# Patient Record
Sex: Female | Born: 1975
Health system: Southern US, Community
[De-identification: ages and names within clinical notes are randomized; demographics above are authoritative.]

## PROBLEM LIST (undated history)

## (undated) DIAGNOSIS — R112 Nausea with vomiting, unspecified: Secondary | ICD-10-CM

## (undated) DIAGNOSIS — N649 Disorder of breast, unspecified: Secondary | ICD-10-CM

## (undated) DIAGNOSIS — I10 Essential (primary) hypertension: Secondary | ICD-10-CM

## (undated) DIAGNOSIS — F988 Other specified behavioral and emotional disorders with onset usually occurring in childhood and adolescence: Secondary | ICD-10-CM

## (undated) DIAGNOSIS — Z9889 Other specified postprocedural states: Secondary | ICD-10-CM

## (undated) HISTORY — PX: VAGINAL HYSTERECTOMY: SUR661

## (undated) HISTORY — PX: WRIST GANGLION EXCISION: SUR520

## (undated) HISTORY — PX: CERVICAL DISC SURGERY: SHX588

## (undated) HISTORY — DX: Disorder of breast, unspecified: N64.9

## (undated) HISTORY — DX: Other specified behavioral and emotional disorders with onset usually occurring in childhood and adolescence: F98.8

## (undated) HISTORY — PX: ABDOMINAL HYSTERECTOMY: SHX81

## (undated) HISTORY — PX: HERNIA REPAIR: SHX51

## (undated) HISTORY — PX: CYSTO: SHX6284

---

## 1999-03-11 ENCOUNTER — Inpatient Hospital Stay (HOSPITAL_COMMUNITY): Admission: AD | Admit: 1999-03-11 | Discharge: 1999-03-13 | Payer: Self-pay | Admitting: Gynecology

## 2001-05-06 ENCOUNTER — Other Ambulatory Visit: Admission: RE | Admit: 2001-05-06 | Discharge: 2001-05-06 | Payer: Self-pay | Admitting: Gynecology

## 2001-05-31 ENCOUNTER — Other Ambulatory Visit: Admission: RE | Admit: 2001-05-31 | Discharge: 2001-05-31 | Payer: Self-pay | Admitting: *Deleted

## 2001-11-09 ENCOUNTER — Encounter: Payer: Self-pay | Admitting: Emergency Medicine

## 2001-11-09 ENCOUNTER — Emergency Department (HOSPITAL_COMMUNITY): Admission: EM | Admit: 2001-11-09 | Discharge: 2001-11-09 | Payer: Self-pay | Admitting: Emergency Medicine

## 2002-11-26 ENCOUNTER — Encounter: Payer: Self-pay | Admitting: Emergency Medicine

## 2002-11-26 ENCOUNTER — Emergency Department (HOSPITAL_COMMUNITY): Admission: EM | Admit: 2002-11-26 | Discharge: 2002-11-26 | Payer: Self-pay | Admitting: Emergency Medicine

## 2002-11-27 ENCOUNTER — Encounter: Payer: Self-pay | Admitting: Emergency Medicine

## 2002-11-27 ENCOUNTER — Ambulatory Visit (HOSPITAL_COMMUNITY): Admission: RE | Admit: 2002-11-27 | Discharge: 2002-11-27 | Payer: Self-pay | Admitting: Unknown Physician Specialty

## 2005-06-16 ENCOUNTER — Emergency Department (HOSPITAL_COMMUNITY): Admission: EM | Admit: 2005-06-16 | Discharge: 2005-06-16 | Payer: Self-pay | Admitting: Family Medicine

## 2005-08-30 ENCOUNTER — Emergency Department (HOSPITAL_COMMUNITY): Admission: EM | Admit: 2005-08-30 | Discharge: 2005-08-30 | Payer: Self-pay | Admitting: Emergency Medicine

## 2005-12-21 ENCOUNTER — Inpatient Hospital Stay (HOSPITAL_COMMUNITY): Admission: RE | Admit: 2005-12-21 | Discharge: 2005-12-22 | Payer: Self-pay | Admitting: Obstetrics and Gynecology

## 2005-12-21 ENCOUNTER — Encounter (INDEPENDENT_AMBULATORY_CARE_PROVIDER_SITE_OTHER): Payer: Self-pay | Admitting: Specialist

## 2005-12-22 ENCOUNTER — Emergency Department (HOSPITAL_COMMUNITY): Admission: EM | Admit: 2005-12-22 | Discharge: 2005-12-22 | Payer: Self-pay | Admitting: Emergency Medicine

## 2006-02-10 ENCOUNTER — Inpatient Hospital Stay (HOSPITAL_COMMUNITY): Admission: AD | Admit: 2006-02-10 | Discharge: 2006-02-14 | Payer: Self-pay | Admitting: Obstetrics and Gynecology

## 2006-02-10 ENCOUNTER — Emergency Department (HOSPITAL_COMMUNITY): Admission: EM | Admit: 2006-02-10 | Discharge: 2006-02-10 | Payer: Self-pay | Admitting: Emergency Medicine

## 2006-02-12 ENCOUNTER — Encounter (INDEPENDENT_AMBULATORY_CARE_PROVIDER_SITE_OTHER): Payer: Self-pay | Admitting: *Deleted

## 2006-06-19 ENCOUNTER — Emergency Department (HOSPITAL_COMMUNITY): Admission: EM | Admit: 2006-06-19 | Discharge: 2006-06-19 | Payer: Self-pay | Admitting: Family Medicine

## 2006-06-19 ENCOUNTER — Ambulatory Visit (HOSPITAL_COMMUNITY): Admission: RE | Admit: 2006-06-19 | Discharge: 2006-06-19 | Payer: Self-pay | Admitting: Family Medicine

## 2006-07-16 ENCOUNTER — Inpatient Hospital Stay (HOSPITAL_COMMUNITY): Admission: RE | Admit: 2006-07-16 | Discharge: 2006-07-17 | Payer: Self-pay | Admitting: Surgery

## 2006-08-30 ENCOUNTER — Ambulatory Visit (HOSPITAL_COMMUNITY): Admission: RE | Admit: 2006-08-30 | Discharge: 2006-08-30 | Payer: Self-pay | Admitting: General Surgery

## 2006-11-06 ENCOUNTER — Ambulatory Visit (HOSPITAL_COMMUNITY): Admission: RE | Admit: 2006-11-06 | Discharge: 2006-11-06 | Payer: Self-pay | Admitting: Surgery

## 2007-01-14 ENCOUNTER — Ambulatory Visit (HOSPITAL_COMMUNITY): Admission: RE | Admit: 2007-01-14 | Discharge: 2007-01-14 | Payer: Self-pay | Admitting: Surgery

## 2007-03-06 ENCOUNTER — Ambulatory Visit (HOSPITAL_COMMUNITY): Admission: RE | Admit: 2007-03-06 | Discharge: 2007-03-07 | Payer: Self-pay | Admitting: Surgery

## 2007-04-01 ENCOUNTER — Emergency Department (HOSPITAL_COMMUNITY): Admission: EM | Admit: 2007-04-01 | Discharge: 2007-04-01 | Payer: Self-pay | Admitting: Emergency Medicine

## 2007-07-19 ENCOUNTER — Emergency Department (HOSPITAL_COMMUNITY): Admission: EM | Admit: 2007-07-19 | Discharge: 2007-07-19 | Payer: Self-pay | Admitting: Emergency Medicine

## 2007-07-19 ENCOUNTER — Ambulatory Visit (HOSPITAL_COMMUNITY): Admission: RE | Admit: 2007-07-19 | Discharge: 2007-07-19 | Payer: Self-pay | Admitting: Surgery

## 2007-09-04 ENCOUNTER — Ambulatory Visit (HOSPITAL_COMMUNITY): Admission: RE | Admit: 2007-09-04 | Discharge: 2007-09-04 | Payer: Self-pay | Admitting: Family Medicine

## 2007-10-11 ENCOUNTER — Encounter (HOSPITAL_COMMUNITY): Admission: RE | Admit: 2007-10-11 | Discharge: 2007-11-10 | Payer: Self-pay | Admitting: Family Medicine

## 2008-01-08 ENCOUNTER — Ambulatory Visit (HOSPITAL_COMMUNITY): Admission: RE | Admit: 2008-01-08 | Discharge: 2008-01-09 | Payer: Self-pay | Admitting: Neurosurgery

## 2008-03-18 ENCOUNTER — Emergency Department (HOSPITAL_COMMUNITY): Admission: EM | Admit: 2008-03-18 | Discharge: 2008-03-18 | Payer: Self-pay | Admitting: Family Medicine

## 2008-07-02 ENCOUNTER — Ambulatory Visit (HOSPITAL_COMMUNITY): Admission: RE | Admit: 2008-07-02 | Discharge: 2008-07-02 | Payer: Self-pay | Admitting: General Surgery

## 2008-08-12 ENCOUNTER — Encounter (INDEPENDENT_AMBULATORY_CARE_PROVIDER_SITE_OTHER): Payer: Self-pay | Admitting: General Surgery

## 2008-08-12 ENCOUNTER — Ambulatory Visit (HOSPITAL_COMMUNITY): Admission: RE | Admit: 2008-08-12 | Discharge: 2008-08-13 | Payer: Self-pay | Admitting: General Surgery

## 2008-09-24 ENCOUNTER — Ambulatory Visit (HOSPITAL_COMMUNITY): Admission: RE | Admit: 2008-09-24 | Discharge: 2008-09-24 | Payer: Self-pay | Admitting: Obstetrics and Gynecology

## 2008-09-25 ENCOUNTER — Ambulatory Visit (HOSPITAL_COMMUNITY): Admission: RE | Admit: 2008-09-25 | Discharge: 2008-09-25 | Payer: Self-pay | Admitting: Obstetrics and Gynecology

## 2009-04-02 ENCOUNTER — Emergency Department (HOSPITAL_COMMUNITY): Admission: EM | Admit: 2009-04-02 | Discharge: 2009-04-02 | Payer: Self-pay | Admitting: Emergency Medicine

## 2010-05-08 ENCOUNTER — Encounter: Payer: Self-pay | Admitting: Family Medicine

## 2010-05-08 ENCOUNTER — Encounter: Payer: Self-pay | Admitting: General Surgery

## 2010-05-09 ENCOUNTER — Encounter: Payer: Self-pay | Admitting: Obstetrics and Gynecology

## 2010-07-04 ENCOUNTER — Other Ambulatory Visit: Payer: Self-pay | Admitting: Adult Health

## 2010-07-04 ENCOUNTER — Other Ambulatory Visit (HOSPITAL_COMMUNITY)
Admission: RE | Admit: 2010-07-04 | Discharge: 2010-07-04 | Disposition: A | Discharge status: Home / Self Care | Payer: 59 | Source: Ambulatory Visit | Attending: Obstetrics and Gynecology | Admitting: Obstetrics and Gynecology

## 2010-07-04 DIAGNOSIS — N6459 Other signs and symptoms in breast: Secondary | ICD-10-CM | POA: Insufficient documentation

## 2010-07-06 ENCOUNTER — Ambulatory Visit (HOSPITAL_COMMUNITY)
Admission: RE | Admit: 2010-07-06 | Discharge: 2010-07-06 | Disposition: A | Discharge status: Home / Self Care | Payer: 59 | Source: Ambulatory Visit | Attending: Obstetrics and Gynecology | Admitting: Obstetrics and Gynecology

## 2010-07-06 ENCOUNTER — Other Ambulatory Visit: Payer: Self-pay | Admitting: Obstetrics and Gynecology

## 2010-07-06 ENCOUNTER — Other Ambulatory Visit: Payer: Self-pay | Admitting: Obstetrics & Gynecology

## 2010-07-06 DIAGNOSIS — N63 Unspecified lump in unspecified breast: Secondary | ICD-10-CM

## 2010-07-13 ENCOUNTER — Ambulatory Visit (HOSPITAL_COMMUNITY)
Admission: RE | Admit: 2010-07-13 | Discharge: 2010-07-13 | Disposition: A | Discharge status: Home / Self Care | Payer: 59 | Source: Ambulatory Visit | Attending: Obstetrics and Gynecology | Admitting: Obstetrics and Gynecology

## 2010-07-13 ENCOUNTER — Other Ambulatory Visit: Payer: Self-pay | Admitting: Obstetrics and Gynecology

## 2010-07-13 ENCOUNTER — Other Ambulatory Visit: Payer: Self-pay | Admitting: Radiology

## 2010-07-13 DIAGNOSIS — N63 Unspecified lump in unspecified breast: Secondary | ICD-10-CM

## 2010-07-13 DIAGNOSIS — D249 Benign neoplasm of unspecified breast: Secondary | ICD-10-CM | POA: Insufficient documentation

## 2010-07-25 LAB — CULTURE, ROUTINE-ABSCESS

## 2010-07-27 LAB — DIFFERENTIAL
Eosinophils Absolute: 0.2 10*3/uL (ref 0.0–0.7)
Eosinophils Relative: 2 % (ref 0–5)
Lymphs Abs: 3.9 10*3/uL (ref 0.7–4.0)
Lymphs Abs: 4.7 10*3/uL — ABNORMAL HIGH (ref 0.7–4.0)
Monocytes Absolute: 0.7 10*3/uL (ref 0.1–1.0)
Monocytes Absolute: 1 10*3/uL (ref 0.1–1.0)
Monocytes Relative: 8 % (ref 3–12)
Neutrophils Relative %: 57 % (ref 43–77)

## 2010-07-27 LAB — CBC
HCT: 41.3 % (ref 36.0–46.0)
Hemoglobin: 14.4 g/dL (ref 12.0–15.0)
MCV: 93 fL (ref 78.0–100.0)
MCV: 93 fL (ref 78.0–100.0)
Platelets: 307 10*3/uL (ref 150–400)
Platelets: 344 10*3/uL (ref 150–400)
RDW: 15.2 % (ref 11.5–15.5)
RDW: 15.5 % (ref 11.5–15.5)
WBC: 10.8 10*3/uL — ABNORMAL HIGH (ref 4.0–10.5)
WBC: 13.6 10*3/uL — ABNORMAL HIGH (ref 4.0–10.5)

## 2010-07-27 LAB — BASIC METABOLIC PANEL
BUN: 10 mg/dL (ref 6–23)
Chloride: 99 mEq/L (ref 96–112)
Glucose, Bld: 91 mg/dL (ref 70–99)
Potassium: 4.5 mEq/L (ref 3.5–5.1)

## 2010-08-30 NOTE — Op Note (Signed)
NAME:  [name], [name]            ACCOUNT NO.:  [id number]   MEDICAL RECORD NO.:  [id number]         PATIENT TYPE:  POIB   LOCATION:  A309                          FACILITY:  [facility]   PHYSICIAN:  Tilda Burrow, M.D. DATE OF BIRTH:  Apr 18, 1975   DATE OF PROCEDURE:  08/04/2008  DATE OF DISCHARGE:                               OPERATIVE REPORT   PREOPERATIVE DIAGNOSES:  Obesity with abdominal wall laxity, incisional  hernia, recurrent.   POSTOPERATIVE DIAGNOSES:  Obesity with abdominal wall laxity, incisional  hernia, recurrent.   PROCEDURE:  Panniculectomy.  Note, incisional hernia dictated elsewhere  by Dr. Lovell Sheehan.   SURGEON:  Tilda Burrow, MD   ASSISTANT:  Dalia Heading, MD   ANESTHESIA:  General.   COMPLICATIONS:  Severe coughing paroxysms towards the end of the case.  Displaced significant strain on incision during the immediate postop  time frame.   DETAILS OF PROCEDURE:  The patient was taken to the operating room,  prepped and draped for lower abdominal surgery as previously noted.  She  had a prior Pfannenstiel incision, and history reviewed includes  documentation of 2 prior efforts at laparoscopic repair.  A Pfannenstiel  incision and panniculectomy area to be excised had been marked off in  the preoperative area and confirmed by the patient.  IV antibiotics  prophylaxis administered.  A 50-cm long ellipse of skin removing from  just lateral to the anterosuperior iliac crest on each side across just  above the mons pubis to the opposite side and with a vertical width of  approximately 12 cm was removed.  Using sharp dissection through the  skin, Bovie cautery beneath the skin removing approximately two thirds  of the depth of the subcu fatty tissue leaving approximately 1-cm thick  layer of fatty tissue overlying the underlying fascia layer.  This was  taken free at the lower margin, elevated, raised up, and mobilized up to  just below the umbilicus  allowing sufficient mobility with the 12-cm  wide ellipse skin could be removed with easy skin edge reapproximation  noted.  Careful attention to hemostasis was used with point cautery used  as necessary to achieve adequate hemostasis.  After removal of skin and  fatty tissue, the bed was satisfactorily hemostatic.  At this time, the  case was turned over to Dr. Lovell Sheehan, and I assisted him on the hernia  repair performed to the midline incision.  See operative note dictated  elsewhere by Dr. Lovell Sheehan.   After completion of the closure of the hernia, then surgery was turned  over once again to me where we performed standard closure.  The moist  and laparotomy tapes, which have been placed in lateral aspects of the  incision were removed, and then the subcu fatty tissues were  reapproximated with a series of interrupted 2-0 Vicryl in good  reapproximation.  A flat JP drain was placed in the depth of the subcu  resection.  The skin edges were nicely reapproximating.  The closure of  the skin was performed with subcuticular 3-0 Vicryl in a continuous  running fashion beginning at each corner  and sewn toward the midline.  JP drains had been allowed to exit through separate stab incisions in  the suprapubic area.  As we were completing the suturing and beginning  to clean her up and begin to place drapes, the patient began to wake up.  She immediately went into significant series of harsh coughs and  spasmodic coughing, which lasted several minutes.  A pressure dressing  was rapidly applied while she was coughing, and then the patient was  allowed to go to recovery room in good condition.  The sponge and needle  counts were correct throughout.  The estimated blood loss for the  panniculectomy less than 100 mL.      Tilda Burrow, M.D.  Electronically Signed     JVF/MEDQ  D:  08/21/2008  T:  08/22/2008  Job:  [id number]   cc:   Dalia Heading, M.D.  Fax: [phone]   [facility]

## 2010-08-30 NOTE — Discharge Summary (Signed)
NAMESHAKENA, CALLARI              ACCOUNT NO.:  [id number]   MEDICAL RECORD NO.:  [id number]          PATIENT TYPE:  OIB   LOCATION:  A309                          FACILITY:  [facility]   PHYSICIAN:  Tilda Burrow, M.D. DATE OF BIRTH:  1976-01-28   DATE OF ADMISSION:  08/12/2008  DATE OF DISCHARGE:  LH                               DISCHARGE SUMMARY   ADMISSION DIAGNOSES:  1. Recurrent incisional hernia.  2. Obesity with abdominal wall laxity.   POSTOPERATIVE DIAGNOSES:  1. Recurrent incisional hernia.  2. Obesity with abdominal wall laxity.  3. Postoperative bleeding, incisional.   DISCHARGE MEDICATIONS:  Ventolin inhaler q.4-6h. p.r.n. Ativan inhaler  b.i.d. p.r.n., ibuprofen 800 mg q.8h. p.r.n., Percocet 5/325 thirty  tablets 1 p.o. q.6h. p.r.n. pain. Colace 100 mg p.o. b.i.d. x30  days.   FOLLOW UP:  Follow up in one week with [facility] for Al Pimple drain assessment.   HOSPITAL COURSE:  This 35 year old was admitted for incisional hernia  repair and elective panniculectomy as described in the HPI by Dr.  Lovell Sheehan and Dr. Emelda Fear. Hemoglobin 14.4, hematocrit 41.3, with BUN 10,  creatinine 0.65, potassium 4.5.  She received vancomycin prophylaxis.  She underwent panniculectomy and midline incisional herniorrhaphy as  described in the operative notes with excellent surgical outcome and  placement of Jackson-Pratt drain in the subq space bilaterally. Deep  vein thrombosis prophylaxis included Flowtron and prophylactic Lovenox.  Postoperatively the patient had severe coughing paroxysm which she  associated with her allergies (the patient is a smoker).  During the  first 8 hours postoperative the patient had increased amount of blood  noted in the Jackson-Pratt drain approximately 200 mL of blood was  removed from the JP drain over the first 24 hours.  The patient had  abdominal binder in place. The incision nonetheless felt soft, and there  were no distinct  loculations or hematoma that could be identified on  postoperative day 1.  Bowel function was normal.  She was discharged  home on stool  softener, Percocet, and ciprofloxacin 500 b.i.d. x10  days.  Follow up  in our office.  Follow up in 7 days for incision check and consideration  of drain removal.   ADDENDUM:  Postoperative hemoglobin returned 10.6, hematocrit 30.1,  white count 13.6.      Tilda Burrow, M.D.  Electronically Signed     JVF/MEDQ  D:  08/13/2008  T:  08/13/2008  Job:  [id number]   cc:   Dalia Heading, M.D.  Fax: [phone]   [facility]

## 2010-08-30 NOTE — Op Note (Signed)
[name], [name] NO.:  [id number]   MEDICAL RECORD NO.:  [id number]          PATIENT TYPE:  OIB   LOCATION:  5739                         FACILITY:  [facility]   PHYSICIAN:  Ardeth Sportsman, MD     DATE OF BIRTH:  08/03/1975   DATE OF PROCEDURE:  DATE OF DISCHARGE:  03/07/2007                               OPERATIVE REPORT   PRIMARY CARE PHYSICIAN:  [facility] in [place], [place].   GYNECOLOGIST:  Tilda Burrow, M.D.   SURGEON:  Ardeth Sportsman, MD   ASSISTANT:  None.   PREOPERATIVE DIAGNOSIS:  Recurrent ventral hernia.   POSTOPERATIVE DIAGNOSES:  1. Suprapubic ventral hernia most likely secondary recurrence with      diastasis.  2. Moderate intraabdominal adhesions.   PROCEDURE:  1. Laparoscopic lysis of adhesions x45 minutes (equals 1/3 of the      case).  2. Laparoscopic ventral hernia repair with 20 x 25 cm dual sided      Proceed mesh (ultra light weight polypropylene/cellulose)  3. Laparoscopic takedown of bladder off anterior pelvis with      retacking.   ANESTHESIA:  1. General anesthesia.  2. Local anesthetic and a field block around all port sites.   SPECIMENS:  None.   DRAINS:  None.   ESTIMATED BLOOD LOSS:  20 mL.   COMPLICATIONS:  None apparent.   INDICATIONS:  [name] is a 35 year old obese female who had an  incisional hernia that was repaired by me laparoscopically earlier this  year.  She had developed an episode of severe nausea, vomiting and  retching and felt a tear suprapubically.  She had evidence of recurrence  by CT scan.  Anatomy and physiology of a ventral hernia was discussed  with its risks of incarceration and strangulation, worsening pain.  Options were discussed.  The recommendation was made for laparoscopic,  possible open repair of incisional hernia.  I noted that because it is  very suprapubic, I would probably have to take the bladder down and  retack it up to get appropriate overlap  / coverage.   Risks such as stroke, heart attack, deep venous thrombosis, pulmonary  embolism, death were discussed.  Risks such as bleeding, need for  transfusion, wound infection, abscess, injury to other organs, bladder  injury resulting repair, and prolonged Foley catheterization, hernia  recurrence and other risks were discussed.  Other risks were discussed  and she agreed to proceed.   OPERATIVE FINDINGS:  She had sort of an elliptical suprapubic incisional  hernia with some contracture of the prior Parietex/Seprafilm mesh.  There was a moderate amount of adhesions on the omentum to the Parietex  mesh, interestingly, especially around the edges.  She had about a 14 x  9 cm elliptical defect inferior between the pubis of the bone inferior  to the mesh.  There was no evidence of any significant incarceration or  strangulation.   DESCRIPTION OF PROCEDURE:  Informed consent was confirmed.  The patient  received IV clindamycin and gentamicin given her penicillin allergy.  She underwent general anesthesia without any  difficulty.  She was  positioned supine with both arms tucked.  She had a Foley catheter  sterilely placed.  Her abdomen was prepped and draped in a sterile  fashion.   Entry was gained in the abdomen with the patient in steep reverse  Trendelenburg and left side up to allow placement of a 5-mm port using  optical entry technique.  Capnoperitoneum to 15 mmHg provided good  abdominal insufflation.  On direct visualization, the following ports  were placed:  A 5 mm in the right upper quadrant and a 5 mm in the  umbilicus and a 10-mm port in the left flank.   Sharp dissection and some controlled cautery was used to help free the  omentum off the mesh anteriorly.  Once this was done, then a defect was  noted and measured.  Because this was the suprapubic area and I was  worried about further contracture given her heavy physical activity, I  went ahead and incised the  peritoneum transversely on the lower anterior  abdominal wall, several centimeters cephalad to the pelvic brim and  above the easily seen bladder edge.  The peritoneum was scored  transversely from just superior to the anterior suprailiac spines from  the left over to the right.  Primarily blunt as well as controlled sharp  dissection was used to help free the preperitoneal tissues off on the  anterior abdominal wall.  This helped me get between the bladder and the  anterior pelvis and help bring the anterior wall of the bladder down  easily.  There was some small anterior vessels that had a little bit of  bleeding that were easily controlled with cautery.  We stayed way  anterior to the ureters and at the remainder of pelvic structures.   The defect was measured out.  Given the 9 x 14 size defect and prior  contracture, I placed a 20 x 25 cm mesh.  Used #1 alternating Prolene  and Ethibond stitches around the superior 2/3 of the circumference of  the mesh x8.  The mesh was rolled in, placed in the abdomen, and  unrolled.  It was secured to the anterior abdominal wall in an arch  using a laparoscopic suture device basically from the anterior  suprailiac spine up to near the level of the umbilicus and down.  The  inferior part of the mesh was allowed to flap and rest into the true  pelvis and the rough edge going up to the anterior abdominal wall.  Three #1 Ethibond stitches were passed through a laparoscopic suture  passer through the mesh and out along the superior rim of the suprapubic  just above the pubic rim of the pelvic brim to help provide good  tacking.  The rough edges of the mesh were tacked using a Protacker.   The bladder was retracked up to the mesh to have bladder retacked well  using 2-0 PDS running stitches x2 as well as interrupted stitches.  This  helped reapproximate the peritoneum back over the anterior abdominal  wall to avoid any further defects.   Copious  irrigation was done in the pelvis with clear return.  Inspection  revealed no intraabdominal injury.  There was no active bleeding on the  omentum.  The mesh laid well and provided excellent coverage.  Please  note the mesh had been secured with insufflation turned down to 10 mm to  provide tension free repair.   The patient was extubated and sent to the  recovery room in stable  condition.  I explained the operative findings to the patient's family.  We will see if she will be able to go home tonight versus the next day  or so depending on her pain control of this.      Ardeth Sportsman, MD  Electronically Signed     SCG/MEDQ  D:  03/06/2007  T:  03/07/2007  Job:  [id number]   cc:   Tilda Burrow, M.D.

## 2010-08-30 NOTE — Op Note (Signed)
NAMEGWENDOLYNN, [name]              ACCOUNT NO.:  [id number]   MEDICAL RECORD NO.:  [id number]          PATIENT TYPE:  OIB   LOCATION:  3521                         FACILITY:  [facility]   PHYSICIAN:  Coletta Memos, M.D.     DATE OF BIRTH:  [birth year]   DATE OF PROCEDURE:  01/08/2008  DATE OF DISCHARGE:                               OPERATIVE REPORT   PREOPERATIVE DIAGNOSES:  1. Cervical displaced disk, C6-C7.  2. Cervical spondylosis without myelopathy, C6-C7.  3. Cervical stenosis, C6-C7.  4. Cervical radiculopathy, C6-C7.   POSTOPERATIVE DIAGNOSES:  1. Cervical displaced disk, C6-C7.  2. Cervical spondylosis without myelopathy, C6-C7.  3. Cervical stenosis, C6-C7.  4. Cervical radiculopathy, C6-C7.   PROCEDURE:  1. Anterior cervical decompression, C6-C7.  2. Anterior arthrodesis, C6-C7 using structural allograft.  3. Anterior instrumentation, C6-C7, a Vectra plate 18 mm.  4. Removal of old C5-C6 anterior plate and Atlantis.   COMPLICATIONS:  None.   SURGEON:  Coletta Memos, MD   ASSISTANT:  Hewitt Shorts, MD   INDICATIONS:  [name] presented approximately 11 years after her  first operation at C5-C6 with pain in the left upper extremity.  MRI  showed that she had a disk herniation at C6-C7, compromising both neural  foramina.  I therefore offered and she agreed to undergo operative  decompression.   OPERATIVE NOTE:  [name] was brought to the operating room, intubated  and placed under a general anesthetic without difficulty.  She was  positioned with a head on a horseshoe headrest in a neutral position.  Her neck was prepped and she was draped in a sterile fashion.  I opened  the old incision using a #10 blade and took this down to the platysma.  I dissected rostrally and caudally, and I was able to create an  avascular corridor to the cervical spine.  I identified the plate and  removed that without great difficulty.  I then opened the disk space at  C6-C7.  I  placed distraction pins at C6 and C7 and proceeded with my  decompression.   I decompressed the spinal canal at C6-C7 by removing both disk material  and thickened posterior longitudinal ligament.  I also aggressively  decompress both C7 nerve roots.  After the decompression was completed,  I then proceeded to fuse the bones using structural allograft.   I used a high-speed drill to even out the bony surfaces at C6 and C7, so  that it would accept a 7-mm structural allograft.  I placed that without  great difficulty.  I then turned my attention to anterior  instrumentation.  I placed an 18-mm plate using one of the old holes on  the Atlantis plate and using 3 of the new holes to bridge the gap  between C6 and C7.  Plate was placed without difficulty in good  position.  Bone plug was in good position.  An x-ray was performed, but  she could not actually see much about the plate and screws.  It was at  correct level as we could see the C6 body faintly.  Dr. Newell Coral did  assist with the initial decompression at the spinal cord of C6-C7.  I  then irrigated the wound.  I then closed wound in layered fashion using  Vicryl sutures.  I used Dermabond for sterile dressing.  She tolerated  procedure well.           ______________________________  Coletta Memos, M.D.     KC/MEDQ  D:  01/08/2008  T:  01/09/2008  Job:  [id number]

## 2010-08-30 NOTE — Op Note (Signed)
[name], [name]              ACCOUNT NO.:  [id number]   MEDICAL RECORD NO.:  [id number]          PATIENT TYPE:  OIB   LOCATION:  A309                          FACILITY:  [facility]   PHYSICIAN:  Dalia Heading, M.D.  DATE OF BIRTH:  20-Mar-1976   DATE OF PROCEDURE:  08/12/2008  DATE OF DISCHARGE:                               OPERATIVE REPORT   PREOPERATIVE DIAGNOSIS:  Recurrent incisional hernia.   POSTOPERATIVE DIAGNOSIS:  Recurrent incisional hernia.   PROCEDURE:  Recurrent incisional herniorrhaphy.   SURGEON:  Dalia Heading, MD   ASSISTANT:  Tilda Burrow, MD   ANESTHESIA:  General endotracheal.   INDICATIONS:  The patient is a 35 year old white female who presents  with both a recurrent incisional hernia as well as the need for a  panniculectomy due to obesity.  Dr. Emelda Fear will be dictating his  portion of the panniculectomy.  The risks and benefits of the procedure  including bleeding, infection, and possibly recurrence of the hernia  were fully explained to the patient, gave informed consent.   PROCEDURE NOTE:  The patient was placed in the supine position.  After  induction of general endotracheal anesthesia, the abdomen was prepped  and draped using the usual sterile technique with DuraPrep.  Surgical  site confirmation was performed.   Dr. Emelda Fear proceeded with his panniculectomy.  Once the fascia was  identified, the hernia defect was noted to be in the suprapubic region  along the midline.  This fascia was incised down to the previously  placed mesh.  Several metal packs were removed.  The mesh was then  inverted from the suprapubic region superiorly towards the umbilicus.  The total defect measured approximately 6 cm in its greatest diameter.  There was some extraperitoneal adipose tissue that was excised.  It was  felt that by inverting the mesh, there would be no need to try removal  as she has had 2 previously placed laparoscopic meshes.  The mesh  had a  small defect inferiorly at the suprapubic region.  This was closed  primarily with the rectus muscle using 0 Ethibond interrupted sutures.  The overlying anterior fascial wall was then debrided to healthy tissue  and was closed using 0 Ethibond interrupted sutures.  This was a tension-  free repair.  Dr. Emelda Fear then proceeded to finish the panniculectomy.   All tape and needle counts were correct at the end of the herniorrhaphy.   COMPLICATIONS:  None.   SPECIMEN:  None.   BLOOD LOSS:  Less than 50 mL.      Dalia Heading, M.D.  Electronically Signed     MAJ/MEDQ  D:  08/12/2008  T:  08/12/2008  Job:  [id number]   cc:   Tilda Burrow, M.D.  Fax: [phone]

## 2010-08-30 NOTE — H&P (Signed)
[name], [name]              ACCOUNT NO.:  [id number]   MEDICAL RECORD NO.:  [id number]          PATIENT TYPE:  AMB   LOCATION:  DAY                           FACILITY:  [facility]   PHYSICIAN:  Tilda Burrow, M.D. DATE OF BIRTH:  [birth year]   DATE OF ADMISSION:  DATE OF DISCHARGE:  LH                              HISTORY & PHYSICAL   ADMITTING DIAGNOSES:  1. Abdominal wall laxity, incisional hernia, status post Pfannenstiel      incision scheduled for hernia repair by Dr. Lovell Sheehan.  2. Requested elective partial panniculectomy.   HISTORY OF PRESENT ILLNESS:  This 35 year old female is admitted at this  time for an incisional hernia repair by Dr. Lovell Sheehan.  She came to our  office in consultation regarding getting a partial panniculectomy tummy  tuck at the same time.  She is aware that this procedure is completely  elective procedure and is being performed for cosmetic reasons to reduce  abdominal skin laxity and fullness in the area of her old Pfannenstiel  incision and with extension past the anterior and superior iliac crest  bilaterally.  She is noted to have a slightly asymmetric Pfannenstiel  incision where her prior laparotomy for abscess was performed.  She has  had a prior effort at laparoscopic herniorrhaphy by Dr. Karie Soda  performed on July 16, 2006, without satisfactory repair.  She did have  a laparoscopic lysis of adhesions and ventral hernia repair on July 16, 2006.  She has had a laparoscopic reexcision of inferior abdominal wall  hernia defect on March 07, 2007.  Currently, the plan is to go ahead  and try to repair the fascia by Dr. Lovell Sheehan and my role was to remove  approximately 10 cm wide x 40 cm ellipse of skin and fatty tissue to  improve lower abdominal contouring as per patient's preferences, she is  aware of it.  There is an increased risk of infection associated with in  part during incisions.  We talked to Dr. Lovell Sheehan to ensure that he is  comfortable with the command procedures and does not believe it would  interfere with his primary surgery.   PAST MEDICAL HISTORY:  Benign.   PAST SURGICAL HISTORY:  1. Vaginal hysterectomy years ago complicated by postoperative pelvic      abscess requiring laparotomy and salpingo-oophorectomy.  2. Ventral hernia repair in 2008.   PHYSICAL EXAMINATION:  GENERAL:  History of trigger-point pain in the  lower abdomen.  VITAL SIGNS:  Weight 208, blood pressure 120/86.  HEENT:  Pupils equal, round, and reactive.  Extraocular movements  intact.  NECK:  Supple.  CHEST:  Clear to auscultation.  ABDOMEN:  She has multiple surgical scars with a chronic right lower  quadrant ache, right-sided, intermittent.  GU:  External genitalia is normal.  Vaginal exam normal.  The cervix and  uterus are absent.  The cuff is mobile with  absolutely no tenderness.  There is mobile vaginal apex and the vaginal  cuff is nontender on the right.  EXTREMITIES:  Grossly normal.   PLAN:  Partial panniculectomy as a portion  of Pfannenstiel incision  repair by Dr. Lovell Sheehan on August 12, 2008.      Tilda Burrow, M.D.  Electronically Signed     JVF/MEDQ  D:  08/06/2008  T:  08/07/2008  Job:  [id number]   cc:   Dalia Heading, M.D.  Fax: [phone]

## 2010-08-30 NOTE — H&P (Signed)
NAMELANDREE, [name] NO.:  [id number]   MEDICAL RECORD NO.:  [id number]         PATIENT TYPE:  PAMB   LOCATION:  DAY                           FACILITY:  [facility]   PHYSICIAN:  Dalia Heading, M.D.  DATE OF BIRTH:  10-25-1975   DATE OF ADMISSION:  DATE OF DISCHARGE:  LH                              HISTORY & PHYSICAL   CHIEF COMPLAINT:  Recurrent incisional hernia.   HISTORY OF PRESENT ILLNESS:  The patient is a 35 year old white female  who is referred for evaluation and treatment of a recurrent suprapubic  hernia.  This was confirmed by both physical exam and CT scan of the  abdomen and pelvis.  She has had laparoscopic incisional herniorrhaphies  in both 2007and 2008.  No nausea or vomiting have been noted.  The pain  is made worse with straining.   PAST MEDICAL HISTORY:  Unremarkable.   PAST SURGICAL HISTORY:  Hysterectomy, ruptured ovarian cyst, neck  surgery.   CURRENT MEDICATIONS:  Hydrocodone.   ALLERGIES:  AMOXICILLIN.   REVIEW OF SYSTEMS:  The patient smokes two packs of cigarettes a day.  She drinks alcohol socially.  She denies any other cardiopulmonary  difficulties or bleeding disorders.   PHYSICAL EXAMINATION:  GENERAL:  The patient is a well-developed, well-  nourished white female, in no acute distress.  LUNGS:  Clear to auscultation with equal breath sounds bilaterally.  HEART:  Regular rate and rhythm without S3, S4, or murmurs.  ABDOMEN:  Soft and nondistended.  She is tender in the suprapubic region  with swelling and hernia present, 4 cm in size.  No hepatosplenomegaly  or masses are noted.   IMPRESSION:  Recurrent incisional hernia.   PLAN:  The patient is scheduled for a recurrent incisional herniorrhaphy  with mesh on August 12, 2008.  At the same time, Dr. Emelda Fear will be  performing a panniculectomy.  The risks and benefits of the procedures  including bleeding, infection, and recurrence of the hernia were fully  explained to  the patient, gave informed consent.      Dalia Heading, M.D.  Electronically Signed     MAJ/MEDQ  D:  07/28/2008  T:  07/29/2008  Job:  [id number]   cc:   Tilda Burrow, M.D.  Fax: [phone]

## 2010-09-02 NOTE — Op Note (Signed)
NAMEMEKLIT, COTTA NO.:  [id number]   MEDICAL RECORD NO.:  [id number]          PATIENT TYPE:  INP   LOCATION:  A320                          FACILITY:  [facility]   PHYSICIAN:  Tilda Burrow, M.D. DATE OF BIRTH:  1976/03/20   DATE OF PROCEDURE:  12/21/2005  DATE OF DISCHARGE:                                 OPERATIVE REPORT   PREOPERATIVE DIAGNOSES:  1. Symptomatic rectocele.  2. Dysmenorrhea.  3. Uterine descensus.   POSTOPERATIVE DIAGNOSES:  1. Symptomatic rectocele.  2. Dysmenorrhea.  3. Uterine descensus.  4. Cystocele.   PROCEDURE:  Vaginal hysterectomy, anterior and posterior repair.   SURGEON:  Tilda Burrow, M.D.   ASSISTANTMarlana Salvage   ANESTHESIA:  General, Tyrone Nine, CRNA.   COMPLICATIONS:  None.   FINDINGS:  Generalized pelvic laxity, even more pronounced with the patient  under anesthesia.  Right pararectal defect in the area of the rectocele,  corrected.   DETAILS OF PROCEDURE:  The patient was taken to the operating room, prepped  and draped in the usual fashion for lower abdominal surgery.  The legs were  in high lithotomy support.  Posterior colpotomy incision was made as well as  an anterior incision from 10 o'clock to 2 o'clock, pushing the bladder up  and elevating it.  We were able to identify the posterior cul-de-sac easily  and enter it.  There was a generalized pelvic laxity and relatively large  potential enterocele so this was kept in mind during subsequent closure.  The procedure continued with grasping of the uterosacral ligament complex  with curved Zeppelin clamp, transecting it and zero chronic suture ligating  it with tag placed.  There was a weighted short speculum in the posterior  vagina.  This was now replaced with a long curved speculum that reached in  the posterior colpotomy site.  The lower and upper cardinal ligaments were  clamped, cut and suture ligated on either side.  The patient was noted to  have a  lot better support on the right side than on the left side.  We  marched up the uterus on either side, clamping, cutting and suture ligating  with zero chromic.  The pedicles of the tube, ovary and round ligament  complex were taken down in multiple small bites to avoid future potential  for adhesions.  Pedicles were inspected and found to be hemostatic.  Once  the uterus was removed, there was adequate hemostasis.  The vaginal walls  tended to collapse in both front, back and side-to-side.  Decision was made  to do anterior repair.  We completed closure by first placing a uterosacral  ligament suture from the fairly well-developed right uterosacral ligament to  cross the connective tissue that makes up the bed of the potential  enterocele, grasping fairly close to the more relaxed left uterosacral  ligament and placing this permanent suture in such a way that we could  tighten the cul-de-sac.  The peritoneum was then closed transversely using a  running 2-0 chromic.  A 2-0 Prolene suture was then snugged up sufficiently  to improve  vaginal apex support.  There was some tapering of the vaginal  apex but functional vaginal length was still considered to be good.  Anterior vaginal mucosa was then addressed.   Anterior repair:  Anterior repair was then performed, taking out a 3 cm long  x 2 cm wide ellipse of anterior vaginal mucosa from the front edge of the  hysterectomy incision, having peeled this tissue off of the underlying  bladder.  A little bit more lateral dissection was performed and a series of  horizontal mattress sutures x3 were placed to pull the anterior wall support  together beneath the cystocele.  The anterior vaginal mucosa was then pulled  together with running 2-0 chromic.  The vaginal apex cuff was then closed  front to back, again with functional vaginal length considered acceptable.   Posterior support was quite weak and at this time a double gloved index  finger was  slipped beneath the vaginal bed, rectal exam performed  identifying that the major defect was more on the patient's right side.  A  right-sided pararectal defect related to deliveries was expected.  We split  the vaginal mucosa at the introitus, elevated the vaginal mucosa halfway up  the vagina and peeled away quite easily the underlying rectal tissue from  the vaginal mucosa.  Allis clamps were then used to grasp the well-supported  tissue on the right side which was pretty far lateral and the more lax left-  sided tissue could be pulled from a lower position upward to attach to the  patient's right side in such a way as to rebuild the perineal body and give  an excellent mid-perineal support over the rectum.  It really improved the  anterior support of the rectum.  We placed a series of four sutures, holding  this flap of tissue in place and did this while a double-gloved finger was  in the rectum to insure that no stitches entered the rectum.  At this time,  double gloving was changed, and we then tied those four sutures down with  good results.  A good perineal body was built.  Vaginal mucosa was trimmed  and then pulled together with interrupted 2-0 chromic.  The introitus itself  was closed in the sagittal plane pulling so as to not reduce the introitus  diameter.  The patient had an excellent result by all intraoperative  appearances.  EBL 200 mL.  The patient went to the recovery room with  vaginal packing in place and clear urine obtained per Foley catheter at the  end of the case amounting to 500 mL.      Tilda Burrow, M.D.  Electronically Signed     JVF/MEDQ  D:  12/21/2005  T:  12/21/2005  Job:  [id number]   cc:   Marcelino Duster  Fax: [phone]

## 2010-09-02 NOTE — Discharge Summary (Signed)
[name], [name]              ACCOUNT NO.:  [id number]   MEDICAL RECORD NO.:  [id number]          PATIENT TYPE:  INP   LOCATION:  A428                          FACILITY:  [facility]   PHYSICIAN:  Tilda Burrow, M.D. DATE OF BIRTH:  Mar 01, 1976   DATE OF ADMISSION:  02/10/2006  DATE OF DISCHARGE:  10/31/2007LH                               DISCHARGE SUMMARY   DISCHARGE MEDICATIONS:  1. Levaquin 500 mg p.o. daily.  2. Tylox, one every 4 hours p.r.n. pain.  3. Doxycycline 100 mg p.o. daily.   ADMISSION DIAGNOSES:  1. Hemorrhagic left ovarian cyst.  2. Intolerable pain, left lower quadrant pain.   DISCHARGE DIAGNOSIS:  Left pelvic abdominal cyst.   PROCEDURES:  Laparoscopy, laparotomy with left salpingo-oophorectomy,  evacuation of pelvic abscess on the left.   DETAILS OF CARE.:  The patient was admitted 5 to 6 weeks status post  vaginal hysterectomy with post surgical care noted for mildly elevated  white count, postoperatively treated empirically with doxycycline as she  went home.  At 4 week checkup, she felt fine except for slightly tender  vaginal cuff.  Ovary with suppressed low dose OC's__  but a week later  she was tender, uncomfortable, and presents to the emergency room with  severe pain with a hemorrhagic cyst on the left.  She is admitted for  antibiotic therapy after outpatient treatment failed due to increasing  pain.   She was taken to the operating room on 02/12/2006 for laparoscopy and  laparotomy.  She was found to have both a small abscess and a  hemorrhagic cyst on the left.  A laparotomy was performed and evacuation  of the pelvic abscess, irrigation of the pelvis, and left salpingo-  oophorectomy performed.  She did amazingly well postoperatively.  On  postoperative day 2, she was stable for discharge on doxycycline and  Levaquin.   FOLLOWUP:  Follow up in one week for staple removal.      Tilda Burrow, M.D.  Electronically Signed     JVF/MEDQ  D:  03/27/2006  T:  03/27/2006  Job:  [id number]

## 2010-09-02 NOTE — Op Note (Signed)
[name], [name] NO.:  [id number]   MEDICAL RECORD NO.:  [id number]          PATIENT TYPE:  INP   LOCATION:  5737                         FACILITY:  [facility]   PHYSICIAN:  Ardeth Sportsman, MD     DATE OF BIRTH:  05/09/1975   DATE OF PROCEDURE:  07/16/2006  DATE OF DISCHARGE:                               OPERATIVE REPORT   SURGEON:  Ardeth Sportsman, MD.   ASSISTANTS:  None.   PREOPERATIVE DIAGNOSIS:  Lower abdominal ventral incisional hernia.   POSTOPERATIVE DIAGNOSIS:  Lower abdominal ventral incisional hernias (13  x 10 cm total defect).   PROCEDURES PERFORMED:  1. Laparoscopic lysis of adhesions.  2. Laparoscopic ventral hernia repair with dual-side mesh      (Parietex/Seprafilm), 25 x 20 cm.   ANESTHESIA:  1. General anesthesia.  2. Local anesthetic in a field block around all hernia incision sites.   SPECIMENS:  None.   DRAINS:  None.   ESTIMATED BLOOD LOSS:  Less than 5 mL.   COMPLICATIONS:  None apparent.   INDICATIONS:  [name] is a 35 year old female, who developed a  ventral incisional hernia after a hysterectomy, requiring reoperation.  The nature of the embryology of abdominal wall formation was explained.  Pathophysiology of incisional hernia with its risks of incarceration and  strangulation was explained.  Options were discussed and recommendation  was made for a laparoscopic exploratory, lysis of adhesions and ventral  wall hernia repair with mesh.  The risks such as stroke, heart attack,  deep vein thrombosis, pulmonary embolism and death were discussed.  Risks such as bleeding, need for transfusion, wound infection, abscess,  injury to other organs, recurrent incisional hernia, urinary retention,  postoperative pain, hernia recurrence and other risks were discussed.  Questions were answered and she agreed to proceed.   OPERATIVE FINDINGS:  She had a definite, well-circumscribed 8 x 8-cm  defect with a contiguous 13 x 10-cm  diastasis/hernia defect involving  the base and extending superior to this, running basically from the  umbilicus down to the pubis.  She did have some omentum incarcerated  within it.  There was no evidence of any strangulation.   DESCRIPTION OF PROCEDURES:  Informed consent was confirmed.  The patient  received IV cefazolin without any difficulty.  She had voided just prior  to going to the operating room.  Sequential compression devices were  active during the entire case.  She underwent general anesthesia without  difficulty.  She was positioned supine with both arms tucked.  Her  abdomen was prepped and draped in sterile fashion.   Entry was gained in the abdomen through optical entry with a 5-mm port  in the left upper quadrant with the patient in reverse Trendelenburg,  left side up, on the first attempt.  Capnoperitoneum to 15 mmHg provided  good abdominal insufflation.  Camera inspection revealed the obvious  hernia defect with omentum incarcerated in it.  Under direct  visualization, 5-mm ports were placed in the left flank and the left  lower quadrant.  Another one was placed in the right upper quadrant.  Harmonic dissection was able to help free the omentum from the hernia  sac.  The sac defect was measured out, as noted above.   A 25 x 20-cm dual-side Parietex mesh was chosen so there would be at  least 5 cm of excess coverage circumferentially. Number 1 stitches were  used in 10 interrupted areas on the rough side, using alternating  Ethibond and Prolene stitches.  The tails were rolled inside and the  mesh was then rolled.  The left third quadrant 10-mm port was removed  and the mesh was placed through this defect hole without difficulty.  The mesh was unrolled.  Using the laparoscopic fascial closure  device/suture passer, I was able to grab the tails and bring the tails  out through 10 circumferential stab incisions.  This helped bring the  mesh well out to the  anterior abdominal wall, with some nice laxity and  avoidance of overtension, as we did this with the patient's  capnopreperitoneum around 10 mmHg.  Because the hernia defect went all  the way down pretty much to the pubis bone, I was not able to put a  fascial mesh completely inferiorly, but I was able to do it at the  corners carefully.     A spiral tacker was used to help approximate the mesh edges to the  anterior abdominal wall, as well as help bring the floppy mesh to help  close the defect between the mesh and the anterior peritoneum, to avoid  it being over floppy and help minimize seroma formation.  The mesh lay  well with no exposed rough edges.  The omentum was laid and covered,  with lay over all of the abdominal contents, and there was very little,  if any exposed small bowel, only down in the pelvis away from the mesh.  The capnopreperitoneum was evacuated.  The 10-mm port described above  had been tucked within, underneath and the mesh covered over the 10-mm  port, so it did not require fascial closing.  All the rest were 5-mm  ports.  The skin was then approximated using 4-0 Monocryl on the port  site incisions, and using Dermabond to help close the smaller stab  incisions and reinforce the port site closures.  The patient was  extubated and sent to the recovery room in stable condition.  I  explained the operative findings to the patient's husband and family.  Questions were answered and they expressed understanding and  appreciation.      Ardeth Sportsman, MD  Electronically Signed     SCG/MEDQ  D:  07/16/2006  T:  07/16/2006  Job:  [id number]   cc:   Tilda Burrow, M.D.

## 2010-09-02 NOTE — Op Note (Signed)
NAMEDENEENE, TARVER NO.:  [id number]   MEDICAL RECORD NO.:  [id number]          PATIENT TYPE:  INP   LOCATION:  A428                          FACILITY:  [facility]   PHYSICIAN:  Tilda Burrow, M.D. DATE OF BIRTH:  [birth year]   DATE OF PROCEDURE:  02/12/2006  DATE OF DISCHARGE:                                 OPERATIVE REPORT   PREOPERATIVE DIAGNOSES:  Pelvic pain, hemorrhagic ovarian cyst, pelvic  infection, postoperative hemorrhage of ovarian cyst, left tubo-ovarian  abscess.   PROCEDURE:  Diagnostic laparoscopy, laparotomy with left salpingo-  oophorectomy and evacuation of pelvic abscess.   SURGEON:  Tilda Burrow, M.D.   ASSISTANT:  Black.   ANESTHESIA:  General anesthesia.   SPECIMENS:  Left tube and ovary.  Specimen to the laboratory.   ESTIMATED BLOOD LOSS:  Was 100 mL.   COMPLICATIONS:  None.   DESCRIPTION OF PROCEDURE:  The patient was taken to the operating room and  prepped and draped for an abdominal and vaginal procedure.  A Foley catheter  was inserted and a tenaculum attached to the vaginal cuff.  The laparoscopic  procedure was initiated with infraumbilical vertical 1 cm skin incisions  over the transverse suprapubic incision of a similar length.  A 10 mm  laparoscopic trocar was introduced through the abdominal cavity after the  pneumoperitoneum was achieved, using the Veress needle and water droplet  technique.  Attention was directed to the pelvis.  The suprapubic trocar was  introduced under direct visualization.  The inspection of the pelvis showed  that there were no adhesions to the anterior abdominal wall, although there  was some turbid fluid, a small amount, over the pelvic floor.  This was  suggestive of infection.  The manipulator was inserted through the  suprapubic site and as we successfully elevated the omentum from the pelvis,  there was good stability on the left side, improved.  The right side was  then inspected  and the right tube and ovary were surprisingly normal.  The  appendix was visible on the right side and normal, very short, anterior and  easily accessible.  It was less than 6 cm in length.  The left side had a  large erythematous, indurated, taut cystic structure, just lateral to the  sigmoid colon.  Using the manipulator to try to touch around the edge, we  almost immediately encountered massive purulent material from the area  between the bowel and the cystic mass.  The degree or purulence and the  fragility made efforts at laparoscopic removal futile.  The decision was  made to convert to a laparotomy.   The laparotomy was performed with a standard Pfannenstiel technique using  the same draping that we had done already.  A Balfour retractor was  positioned in the inverted position and easy access to the pelvis  encountered.  The bowel was able to be packed away, after some generous  irrigation of the abdomen on the entry.  Manual palpation of the cleavage  planes showed that the bowel was easily dissected from the medial aspects of  the pubo-ovarian  complex.  There was some bloody purulence obtained from  this area.  We were able to then gently manually dissect the tube and ovary  complex away from the side wall.  The round ligament could be identified and  was taken down from the medial lateral to the complex, which allowed access  into the retroperitoneum on that side.  The remnants of the  broad ligament  on that side were easily opened.  The infundibular pelvic ligament could be  isolated, clamped, cut and suture ligated well away from the pelvic side  walls, and to ensure safety of the area.  This was transected and ligated  successfully with #2-0 chromic; then the remaining attachments from the  inferior aspects of the tube and ovary to the pelvic floor were clamped, cut  and suture ligated as well.  The pelvis was irrigated copiously.  We  inspected the sigmoid colon.  There  was some epiploic fat attachments to the  pelvic floor that were freed up, but a portion of the sigmoid colon was  directly in the midline, and attached to the pelvis over the vaginal  hysterectomy sufficiently, that we did not feel that there was any further  loculations in the area and these were left in place.  This did not appear  to be a residual effect of the inflammation, but post-surgical changes.  Therefore, the procedure was continued, by careful inspection of both sides  of the sigmoid colon, inspecting and looking for bleeding or signs of  perforation of the bowel.  Both bowel surfaces appeared completely normal.  There was a small window in the inferior sigmoid mesentery, slightly larger  than a finger, but allowed through and through passage of the finger, but  this was inspected for hemostasis and found to be adequately hemostatic.  We  irrigated copiously and removed the laparotomy equipment and irrigated  copiously again.  Hemostasis was excellent.  The anterior peritoneum was  closed with #2-0 chromic.  The fascia was closed with running #0 Vicryl.  The right side had lots of muscle on the internal aspects of the internal  oblique muscles, and also the fascia was sewn in two separate layers on the  right side during the closure of the fascia.  The subcutaneous tissues were  irrigated copiously and closed with a couple of interrupted #2-0 plain  sutures, followed by a staple closure of the skin.   The patient tolerated the procedure well and went to the recovery room in  good condition.  The sponge and needle counts were correct.      Tilda Burrow, M.D.  Electronically Signed     JVF/MEDQ  D:  02/13/2006  T:  02/13/2006  Job:  [id number]   cc:   [facility]

## 2010-09-02 NOTE — H&P (Signed)
[name], [name]              ACCOUNT NO.:  [id number]   MEDICAL RECORD NO.:  [id number]          PATIENT TYPE:  AMB   LOCATION:  DAY                           FACILITY:  [facility]   PHYSICIAN:  Tilda Burrow, M.D. DATE OF BIRTH:  20-Apr-1975   DATE OF ADMISSION:  DATE OF DISCHARGE:  LH                                HISTORY & PHYSICAL   ADMISSION DIAGNOSIS:  Uterine descensus, rectocele, dysmenorrhea.   HISTORY OF PRESENT ILLNESS:  This 35 year old female was admitted for  symptomatic rectocele with difficulty with defecation.  She had a mild  cystocele and rectocele.  She has a strong anal sphincter with very lax  posterior wall above it, marked posterior laxity except for the sphincter,  itself.  The rectocele was symptomatic.  She has difficulty when she is on  her menses when her bowels tend to be more constipated.  She has shooting  pain in the rectal area when she sits related to the slow bowel evacuation.  She denies dyspareunia but does complain of discomfort during uterine  contact during intimacy or vaginal probe.  Uterine size is considered within  normal limits on ultrasound at 91.6 mL volume.  There is no discernible  fibroids, thin endometrial stripe, normal adnexal structures.  She was  originally considered for surgery earlier this year and delayed it until  this fall.  Consideration was given to anterior repair, but after further  discussion, we have decided to hold off on bladder repair at this time.  We  have the option of addressing this with the patient asleep, if the laxity  appears marked or if hysterectomy and apex support does not improve the  laxity.   GYN HISTORY:  Notable for the patient describing herself as having torn at  her only delivery and not having it put back together very much.  She has  described her menses as heavy, lasting five days with quarter size clots,  lots of cramps.  Her last menstrual period was December 06, 2005.   PAST MEDICAL  HISTORY:  Reported as benign.   PAST SURGICAL HISTORY:  Cervical disc of the neck in 1998.  Ganglion cyst of  the right wrist.   ALLERGIES:  Amoxicillin.   MEDICATIONS:  None taken.   PHYSICAL EXAMINATION:  VITAL SIGNS:  Height 5 feet 7 inches, weight 207 pounds, blood pressure  118/17.  GENERAL:  A large framed Caucasian female, alert and oriented x3.  HEENT:  Pupils equal, round, and reactive.  NECK:  Supple.  CHEST:  Clear to auscultation.  ABDOMEN:  Obese without masses.  GU:  External genitalia multiparous.  Uterus anteflexed.  Adnexa nontender.  Adnexa negative on ultrasound with  small uterus.  Posterior laxity present with first degree uterine descensus  allowing removal of the uterus at the time of posterior repair.   PLAN:  Vaginal hysterectomy, posterior repair, December 21, 2005.      Tilda Burrow, M.D.  Electronically Signed     JVF/MEDQ  D:  12/20/2005  T:  12/20/2005  Job:  [id number]   cc:   Marilu Favre  [place]  Fax: [phone]

## 2010-09-02 NOTE — Consult Note (Signed)
[name], [name] NO.:  [id number]   MEDICAL RECORD NO.:  [id number]          PATIENT TYPE:  EMS   LOCATION:  ED                            FACILITY:  [facility]   PHYSICIAN:  Tilda Burrow, M.D. DATE OF BIRTH:  Sep 29, 1975   DATE OF CONSULTATION:  02/10/2006  DATE OF DISCHARGE:  02/10/2006                                   CONSULTATION   CHIEF COMPLAINT:  Severe left lower quadrant pain.   HISTORY OF PRESENT ILLNESS:  This 35 year old [employer] employee  approximately 6 weeks status post abdominal hysterectomy that went  beautifully with very little post-surgical discomfort until her postop visit  when she was noted to have some discomfort upon palpation of the vaginal  cuff raising the question of left ovarian cyst attachment to the cuff.  She  was doing well at home but had acute onset of abdominal pain this a.m.,  sufficiently severe to make her throw up once and bring her to the emergency  room.  Bowel function yesterday was normal.   She has been evaluated by Dr. Greta Doom.  She her notes transcribed  elsewhere.  She has had a transvaginal ultrasound which shows a 7-cm cystic  left ovary.  There is minimal blood outside the ovary.  At the time of  surgery, the ovaries were grossly normal in appearance.  Most recently,  since her last office visit about a week ago, she has been on oral  contraceptives, but it apparently has not taken control of hormonal function  yet.   PHYSICAL EXAMINATION:  The patient is sitting up, alert, comfortable with  her analgesics, having slight itching from the Dilaudid that she received  earlier.  She was given the option of inpatient observation but states that  she is comfortable and would rather go home.  She has excellent skin color  and turgor.   RECOMMENDATIONS:  Given the stable condition that she presents, I think it  is reasonable for her to attempt to be at home.  Access to contact me has  been discussed at  length.  The patient is completely comfortable with the  idea and actually prefers to be at home.  Warning signs and symptoms to look  for, such as lightheadedness, dizziness, change in skin pallor, etc., were  discussed with the patient and [name], her husband.  She will follow  up as scheduled in a couple of weeks or earlier p.r.n. continued discomfort.  The patient should expect today's acute pain to improve with 3-5 days, and  we will see her earlier p.r.n. persistence of symptoms.   IMPRESSION:  Hemorrhagic left ovarian cyst.  (Torsion is considered unlikely  with good surface flow in the ovary noted on Doppler.)      Tilda Burrow, M.D.  Electronically Signed     JVF/MEDQ  D:  02/10/2006  T:  02/10/2006  Job:  [id number]   cc:   Nicoletta Dress. Colon Branch, M.D.  Fax: [phone]

## 2010-09-02 NOTE — Discharge Summary (Signed)
[name], [name] NO.:  [id number]   MEDICAL RECORD NO.:  [id number]          PATIENT TYPE:  INP   LOCATION:  A320                          FACILITY:  [facility]   PHYSICIAN:  Tilda Burrow, M.D. DATE OF BIRTH:  [birth year]   DATE OF ADMISSION:  12/21/2005  DATE OF DISCHARGE:  09/07/2007LH                                 DISCHARGE SUMMARY   ADMISSION DIAGNOSES:  1. Uterine descensus.  2. Rectocele.  3. Dysmenorrhea.   POSTOPERATIVE DIAGNOSES:  1. Uterine descensus.  2. Rectocele.  3. Dysmenorrhea.  4. Cystocele.   PROCEDURE:  Vaginal hysterectomy, anterior and posterior repair, December 21, 2005.   SURGEON:  Tilda Burrow, M.D.   DISCHARGE MEDICATIONS:  1. Laxative of choice twice daily x30 days.  2. Dilaudid 2 mg tablets x10 one q.6h. p.r.n. severe pain.  3. Phenergan 25 mg 10 one q.6h. p.r.n. nausea.  4. Benadryl 25 mg 15, one q.6h. p.r.n. itching.  5. Darvocet N 100 30 tablets 1-2 q.4h. p.r.n. mild pain.  6. Doxycycline 100 mg b.i.d. x5 days.   COMPLICATIONS:  Pruritus after taking Tylox tablets and Buprenex IV,  uncertain which agent she is reacting to.   A 35 year old female admitted for symptomatic rectocele, mild cystocele that  was originally not planned for surgery.  Small uterus was noted.  See HPI  for details.   HOSPITAL COURSE:  Patient was admitted and underwent vaginal hysterectomy,  posterior and anterior repair.  The admitting hemoglobin was 14, hematocrit  42.  Postoperative hemoglobin 12.7, hematocrit 37.5, white count was 10,500  preprocedure, was 19,400 postoperatively with 68 neutrophils.  She remained  afebrile and will be discharged home.   ADDENDUM:  Due to the increased leukocytosis, we will add doxycycline 100 mg  b.i.d. x5 days.   FOLLOW UP:  Two weeks.      Tilda Burrow, M.D.  Electronically Signed     JVF/MEDQ  D:  12/22/2005  T:  12/22/2005  Job:  [id number]   cc:   [facility]

## 2010-09-02 NOTE — H&P (Signed)
NAMEJAKIAH, BIENAIME NO.:  [id number]   MEDICAL RECORD NO.:  [id number]          PATIENT TYPE:  INP   LOCATION:  A428                          FACILITY:  [facility]   PHYSICIAN:  Tilda Burrow, M.D. DATE OF BIRTH:  July 23, 1975   DATE OF ADMISSION:  02/10/2006  DATE OF DISCHARGE:  LH                                HISTORY & PHYSICAL   ADMITTING DIAGNOSES:  1. Hemorrhagic left ovarian cyst, intolerable pain and discomfort      associated with it.  2. Left lower quadrant pain.   HISTORY OF PRESENT ILLNESS:  This is a 35 year old female, 5-6 weeks status  post vaginal hysterectomy, anterior and posterior repair that went  beautifully, with very little postsurgical discomfort, with postsurgical  care notable for mildly elevated white count postop, treated empirically  with doxycycline as she went home.  She was seen back in our office for 4-  week checkup and felt fine except that the vaginal cuff was slightly tender.  She was placed on oral contraceptives to suppress the ovary but remained  afebrile, but now a week later presented to the emergency room earlier on  February 10, 2006, where a cystic and 7 cm left ovary with hemorrhage into  the ovary was identified.  She was sent home for home management, but pain  levels increased and she was readmitted for pain management late on February 10, 2006.  Discussion this a.m., February 11, 2006, shows the pain is  requiring significant narcotics, and after discussion of treatment options,  risks and benefits, the patient wishes to proceed with removal of the left  tube and ovary.  We will attempt laparoscopic assessment and possible  laparoscopic removal of the left tube and ovary and evacuation of the  hemorrhagic cyst, though the possibility of laparotomy is considered,  depending on findings at the time of surgery.   PAST MEDICAL HISTORY:  Benign.   SURGICAL HISTORY:  Vaginal hysterectomy and anterior and posterior  repair 5-  6 weeks ago.   MEDICATIONS:  1. Tylox 2 q.6 h. p.r.n. pain.  2. Toradol 1 q.6 h. p.r.n. pain.  3. Chantix 2 daily for smoking cessation.  4. Ortho Tri-Cyclen twice daily.   ALLERGIES:  1. AMOXICILLIN.  2. She also had an urticarial reaction to Buprenex while in the hospital.      We switched her to Tylox, and the itching persisted.  It was unclear if      this represented a reaction to the Tylox.  Responded to Benadryl and      Solu-Medrol given in the emergency room the day of her discharge.   PHYSICAL EXAMINATION:  GENERAL:  A large-framed alert, oriented,  intelligent, articulate Caucasian female.  HEENT:  Pupils equal, round, and reactive.  NECK:  Supple.  CARDIOVASCULAR:  Unremarkable.  LUNGS:  Clear.  ABDOMEN:  Left lower quadrant discomfort.  Bowel sounds hypoactive but  present in all four quadrants.  There is mild midabdominal discomfort this  a.m., increased from yesterday.  She has a PCA pump, Dilaudid, in place, and  is using it  frequently.  This morning, she is able to keep down clear  liquids, but the pain levels remain unacceptable.  PELVIC:  Deferred.  With last visit in the office, there was tenderness of  the vaginal cuff, likely related to cyst contact with the cuff.   PLAN:  Laparoscopy and possible laparotomy for removal of left tube and  ovary and lysis of adhesions tomorrow, February 12, 2006.      Tilda Burrow, M.D.  Electronically Signed     JVF/MEDQ  D:  02/11/2006  T:  02/12/2006  Job:  [id number]   cc:   [facility]

## 2011-01-16 LAB — CBC
HCT: 38
Hemoglobin: 13.3
MCHC: 35
MCV: 91.3
RDW: 13.7

## 2011-01-20 LAB — CBC
HCT: 40.8
Platelets: 378
RDW: 12.9

## 2011-01-20 LAB — BASIC METABOLIC PANEL
BUN: 9
Calcium: 9.4
Creatinine, Ser: 0.69
GFR calc non Af Amer: >60
Glucose, Bld: 111 — ABNORMAL HIGH

## 2011-01-20 LAB — DIFFERENTIAL
Blasts: 0
Lymphocytes Relative: 26
Metamyelocytes Relative: 0
Monocytes Relative: 2 — ABNORMAL LOW
nRBC: 0

## 2011-01-24 LAB — CBC
Hemoglobin: 13.9
RDW: 13.8

## 2011-01-25 ENCOUNTER — Other Ambulatory Visit (HOSPITAL_COMMUNITY): Payer: Self-pay | Admitting: Family Medicine

## 2011-01-25 DIAGNOSIS — R1032 Left lower quadrant pain: Secondary | ICD-10-CM

## 2011-01-25 DIAGNOSIS — K4090 Unilateral inguinal hernia, without obstruction or gangrene, not specified as recurrent: Secondary | ICD-10-CM

## 2011-01-27 ENCOUNTER — Ambulatory Visit (HOSPITAL_COMMUNITY): Payer: 59

## 2011-02-03 ENCOUNTER — Ambulatory Visit (HOSPITAL_COMMUNITY)
Admission: RE | Admit: 2011-02-03 | Discharge: 2011-02-03 | Disposition: A | Discharge status: Home / Self Care | Payer: 59 | Source: Ambulatory Visit | Attending: Family Medicine | Admitting: Family Medicine

## 2011-02-03 DIAGNOSIS — R1032 Left lower quadrant pain: Secondary | ICD-10-CM

## 2011-02-03 DIAGNOSIS — K4090 Unilateral inguinal hernia, without obstruction or gangrene, not specified as recurrent: Secondary | ICD-10-CM

## 2011-02-03 DIAGNOSIS — Z9889 Other specified postprocedural states: Secondary | ICD-10-CM | POA: Insufficient documentation

## 2011-02-03 DIAGNOSIS — K469 Unspecified abdominal hernia without obstruction or gangrene: Secondary | ICD-10-CM | POA: Insufficient documentation

## 2011-02-03 MED ORDER — IOHEXOL 300 MG/ML  SOLN
100.0000 mL | Freq: Once | INTRAMUSCULAR | Status: AC | PRN
  Administered 2011-02-03: 100 mL via INTRAVENOUS

## 2012-07-09 ENCOUNTER — Encounter: Payer: Self-pay | Admitting: *Deleted

## 2012-07-10 ENCOUNTER — Ambulatory Visit (INDEPENDENT_AMBULATORY_CARE_PROVIDER_SITE_OTHER): Payer: 59 | Admitting: Obstetrics and Gynecology

## 2012-07-10 ENCOUNTER — Encounter: Payer: Self-pay | Admitting: Obstetrics and Gynecology

## 2012-07-10 VITALS — BP 132/70 | Ht 66.0 in | Wt 230.0 lb

## 2012-07-10 DIAGNOSIS — R1031 Right lower quadrant pain: Secondary | ICD-10-CM

## 2012-07-10 DIAGNOSIS — F988 Other specified behavioral and emotional disorders with onset usually occurring in childhood and adolescence: Secondary | ICD-10-CM

## 2012-07-10 DIAGNOSIS — N6320 Unspecified lump in the left breast, unspecified quadrant: Secondary | ICD-10-CM | POA: Insufficient documentation

## 2012-07-10 DIAGNOSIS — N63 Unspecified lump in unspecified breast: Secondary | ICD-10-CM

## 2012-07-10 NOTE — Progress Notes (Signed)
Subjective:     [name] is an 37 y.o. female who presents for evaluation of a breast mass. Change was noted several weeks ago, and has been stable since first identified. Patient does routinely do self breast exams. The mass does not change during menstrual cycle. The mass is tender. Patient denies nipple discharge. Breast cancer risk factors include: mom with breast CA.  The following portions of the patient's history were reviewed and updated as appropriate: past medical history, past social history and past surgical history.  Review of Systems Gastrointestinal: positive for abdominal pain  on rlq at liine of old scar, esp with coughing   Objective:     breast: small firm tender nodule probable lymph node on left axilla      Assessment:    breast mass, likely benign   muscle wall strain rlq with exercise Plan:    Arranged for ultrasound. and mammogram diagnostic l breast

## 2012-07-17 ENCOUNTER — Ambulatory Visit (HOSPITAL_COMMUNITY)
Admission: RE | Admit: 2012-07-17 | Discharge: 2012-07-17 | Disposition: A | Discharge status: Home / Self Care | Payer: 59 | Source: Ambulatory Visit | Attending: Obstetrics and Gynecology | Admitting: Obstetrics and Gynecology

## 2012-07-17 ENCOUNTER — Other Ambulatory Visit: Payer: Self-pay | Admitting: Obstetrics and Gynecology

## 2012-07-17 DIAGNOSIS — N63 Unspecified lump in unspecified breast: Secondary | ICD-10-CM

## 2012-07-17 DIAGNOSIS — N644 Mastodynia: Secondary | ICD-10-CM | POA: Insufficient documentation

## 2012-07-17 DIAGNOSIS — D249 Benign neoplasm of unspecified breast: Secondary | ICD-10-CM | POA: Insufficient documentation

## 2014-02-16 ENCOUNTER — Encounter: Payer: Self-pay | Admitting: Obstetrics and Gynecology

## 2014-04-02 ENCOUNTER — Other Ambulatory Visit (HOSPITAL_COMMUNITY): Payer: Self-pay | Admitting: *Deleted

## 2014-04-02 ENCOUNTER — Ambulatory Visit (HOSPITAL_COMMUNITY)
Admission: RE | Admit: 2014-04-02 | Discharge: 2014-04-02 | Disposition: A | Discharge status: Home / Self Care | Payer: 59 | Source: Ambulatory Visit | Attending: Family Medicine | Admitting: Family Medicine

## 2014-04-02 DIAGNOSIS — W19XXXA Unspecified fall, initial encounter: Secondary | ICD-10-CM | POA: Diagnosis not present

## 2014-04-02 DIAGNOSIS — Z981 Arthrodesis status: Secondary | ICD-10-CM | POA: Diagnosis not present

## 2014-04-02 DIAGNOSIS — M542 Cervicalgia: Secondary | ICD-10-CM

## 2014-04-02 DIAGNOSIS — M5032 Other cervical disc degeneration, mid-cervical region: Secondary | ICD-10-CM | POA: Diagnosis not present

## 2014-05-26 ENCOUNTER — Other Ambulatory Visit (HOSPITAL_COMMUNITY): Payer: Self-pay | Admitting: Family Medicine

## 2014-05-26 DIAGNOSIS — M542 Cervicalgia: Secondary | ICD-10-CM

## 2014-06-02 ENCOUNTER — Ambulatory Visit (HOSPITAL_COMMUNITY)
Admission: RE | Admit: 2014-06-02 | Discharge: 2014-06-02 | Disposition: A | Discharge status: Home / Self Care | Payer: 59 | Source: Ambulatory Visit | Attending: Family Medicine | Admitting: Family Medicine

## 2014-06-02 DIAGNOSIS — M5021 Other cervical disc displacement,  high cervical region: Secondary | ICD-10-CM | POA: Insufficient documentation

## 2014-06-02 DIAGNOSIS — M2578 Osteophyte, vertebrae: Secondary | ICD-10-CM | POA: Diagnosis not present

## 2014-06-02 DIAGNOSIS — M79602 Pain in left arm: Secondary | ICD-10-CM | POA: Insufficient documentation

## 2014-06-02 DIAGNOSIS — R200 Anesthesia of skin: Secondary | ICD-10-CM | POA: Diagnosis not present

## 2014-06-02 DIAGNOSIS — M4802 Spinal stenosis, cervical region: Secondary | ICD-10-CM | POA: Diagnosis not present

## 2014-06-02 DIAGNOSIS — M5032 Other cervical disc degeneration, mid-cervical region: Secondary | ICD-10-CM | POA: Insufficient documentation

## 2014-06-02 DIAGNOSIS — M542 Cervicalgia: Secondary | ICD-10-CM | POA: Diagnosis present

## 2014-06-02 MED ORDER — GADOBENATE DIMEGLUMINE 529 MG/ML IV SOLN
20.0000 mL | Freq: Once | INTRAVENOUS | Status: AC | PRN
  Administered 2014-06-02: 20 mL via INTRAVENOUS

## 2014-06-20 ENCOUNTER — Telehealth: Payer: Self-pay | Admitting: Obstetrics and Gynecology

## 2014-06-20 NOTE — Telephone Encounter (Signed)
Opened in error

## 2014-06-25 ENCOUNTER — Other Ambulatory Visit (HOSPITAL_COMMUNITY): Payer: Self-pay | Admitting: Neurosurgery

## 2014-06-25 DIAGNOSIS — M47812 Spondylosis without myelopathy or radiculopathy, cervical region: Secondary | ICD-10-CM

## 2014-07-03 ENCOUNTER — Ambulatory Visit (HOSPITAL_COMMUNITY)
Admission: RE | Admit: 2014-07-03 | Discharge: 2014-07-03 | Disposition: A | Discharge status: Home / Self Care | Payer: 59 | Source: Ambulatory Visit | Attending: Neurosurgery | Admitting: Neurosurgery

## 2014-07-03 VITALS — BP 121/46 | HR 70 | Temp 98.2°F | Resp 16 | Ht 66.0 in | Wt 235.0 lb

## 2014-07-03 DIAGNOSIS — M47812 Spondylosis without myelopathy or radiculopathy, cervical region: Secondary | ICD-10-CM

## 2014-07-03 DIAGNOSIS — M5412 Radiculopathy, cervical region: Secondary | ICD-10-CM | POA: Diagnosis not present

## 2014-07-03 DIAGNOSIS — M542 Cervicalgia: Secondary | ICD-10-CM | POA: Diagnosis present

## 2014-07-03 DIAGNOSIS — M4722 Other spondylosis with radiculopathy, cervical region: Secondary | ICD-10-CM | POA: Diagnosis present

## 2014-07-03 MED ORDER — DIAZEPAM 5 MG PO TABS
ORAL_TABLET | ORAL | Status: AC
  Filled 2014-07-03: qty 2

## 2014-07-03 MED ORDER — IOHEXOL 300 MG/ML  SOLN
10.0000 mL | Freq: Once | INTRAMUSCULAR | Status: AC | PRN
  Administered 2014-07-03: 10 mL via INTRA_ARTERIAL

## 2014-07-03 MED ORDER — OXYCODONE HCL 5 MG PO TABS
5.0000 mg | ORAL_TABLET | ORAL | Status: DC | PRN
  Administered 2014-07-03: 5 mg via ORAL

## 2014-07-03 MED ORDER — ONDANSETRON HCL 4 MG/2ML IJ SOLN: 4.0000 mg | Freq: Four times a day (QID) | INTRAMUSCULAR | Status: DC | PRN

## 2014-07-03 MED ORDER — OXYCODONE HCL 5 MG PO TABS
ORAL_TABLET | ORAL | Status: AC
  Filled 2014-07-03: qty 1

## 2014-07-03 MED ORDER — DIAZEPAM 5 MG PO TABS
10.0000 mg | ORAL_TABLET | Freq: Once | ORAL | Status: AC
  Administered 2014-07-03: 10 mg via ORAL

## 2014-07-03 NOTE — Op Note (Signed)
07/03/2014 Cervical Myelogram  PATIENT:  [name] is a 39 y.o. female  PRE-OPERATIVE DIAGNOSIS:  Cervicalgia, osteoarthritis with radiculopathy cervical  POST-OPERATIVE DIAGNOSIS:  same  PROCEDURE:  Cervical Myelogram  SURGEON:  [place]  ANESTHESIA:   local LOCAL MEDICATIONS USED:  LIDOCAINE  Procedure Note: [name] is a 39 y.o. female whom Was taken to the fluoroscopy suite and  positioned prone on the fluoroscopy table. Her back was prepared and draped in a sterile manner. I infiltrated 5 cc into the lumbar region. I then introduced a spinal needle into the thecal sac at the L4/5 interlaminar space. I infiltrated 10cc of Omnipaque 300 into the thecal sac. Fluoroscopy showed the needle and contrast in the thecal sac. [name] tolerated the procedure well. she Will be taken to CT for evaluation.     PATIENT DISPOSITION:  PACU - hemodynamically stable.

## 2014-07-03 NOTE — Progress Notes (Signed)
C/o pain 9/10 to back post myelogram

## 2014-07-03 NOTE — Discharge Instructions (Signed)
Myelography, Care After °These instructions give you information on caring for yourself after your procedure. Your doctor may also give you specific instructions. Call your doctor if you have any problems or questions after your procedure. °HOME CARE °· Rest often the first day. °· When you rest, lie flat, with your head slightly raised (elevated). °· Avoid heavy lifting and activity for 48 hours. °· You may take the bandage (dressing) off 1 day after the test. °GET HELP RIGHT AWAY IF:  °· You have a very bad headache. °· You have a fever. °MAKE SURE YOU: °· Understand these instructions. °· Will watch your condition. °· Will get help right away if you are not doing well or get worse. °Document Released: 01/11/2008 Document Revised: 08/18/2013 Document Reviewed: 07/23/2013 °ExitCare® Patient Information ©2015 [employer]. This information is not intended to replace advice given to you by your health care provider. Make sure you discuss any questions you have with your health care provider. ° ° °

## 2015-05-03 DIAGNOSIS — F1121 Opioid dependence, in remission: Secondary | ICD-10-CM | POA: Diagnosis not present

## 2015-05-03 DIAGNOSIS — F909 Attention-deficit hyperactivity disorder, unspecified type: Secondary | ICD-10-CM | POA: Diagnosis not present

## 2015-05-03 DIAGNOSIS — Z7151 Drug abuse counseling and surveillance of drug abuser: Secondary | ICD-10-CM | POA: Diagnosis not present

## 2015-05-04 DIAGNOSIS — H524 Presbyopia: Secondary | ICD-10-CM | POA: Diagnosis not present

## 2015-05-12 DIAGNOSIS — F909 Attention-deficit hyperactivity disorder, unspecified type: Secondary | ICD-10-CM | POA: Diagnosis not present

## 2015-05-12 DIAGNOSIS — F1121 Opioid dependence, in remission: Secondary | ICD-10-CM | POA: Diagnosis not present

## 2015-05-14 MED FILL — DEXTROAMP-AMPHET ER 20 MG C: 20 | 30 days supply | Qty: 60 | Fill #0

## 2015-05-17 DIAGNOSIS — F1121 Opioid dependence, in remission: Secondary | ICD-10-CM | POA: Diagnosis not present

## 2015-05-17 DIAGNOSIS — Z7151 Drug abuse counseling and surveillance of drug abuser: Secondary | ICD-10-CM | POA: Diagnosis not present

## 2015-05-24 DIAGNOSIS — Z7151 Drug abuse counseling and surveillance of drug abuser: Secondary | ICD-10-CM | POA: Diagnosis not present

## 2015-05-24 DIAGNOSIS — F1121 Opioid dependence, in remission: Secondary | ICD-10-CM | POA: Diagnosis not present

## 2015-05-31 DIAGNOSIS — J209 Acute bronchitis, unspecified: Secondary | ICD-10-CM | POA: Diagnosis not present

## 2015-06-07 DIAGNOSIS — J0101 Acute recurrent maxillary sinusitis: Secondary | ICD-10-CM | POA: Diagnosis not present

## 2015-06-07 DIAGNOSIS — R05 Cough: Secondary | ICD-10-CM | POA: Diagnosis not present

## 2015-06-10 DIAGNOSIS — F17200 Nicotine dependence, unspecified, uncomplicated: Secondary | ICD-10-CM | POA: Diagnosis not present

## 2015-06-10 DIAGNOSIS — F909 Attention-deficit hyperactivity disorder, unspecified type: Secondary | ICD-10-CM | POA: Diagnosis not present

## 2015-06-10 DIAGNOSIS — F1121 Opioid dependence, in remission: Secondary | ICD-10-CM | POA: Diagnosis not present

## 2015-06-10 DIAGNOSIS — Z3200 Encounter for pregnancy test, result unknown: Secondary | ICD-10-CM | POA: Diagnosis not present

## 2015-06-14 DIAGNOSIS — Z7151 Drug abuse counseling and surveillance of drug abuser: Secondary | ICD-10-CM | POA: Diagnosis not present

## 2015-06-14 DIAGNOSIS — F909 Attention-deficit hyperactivity disorder, unspecified type: Secondary | ICD-10-CM | POA: Diagnosis not present

## 2015-06-14 DIAGNOSIS — F1121 Opioid dependence, in remission: Secondary | ICD-10-CM | POA: Diagnosis not present

## 2015-06-15 DIAGNOSIS — F1121 Opioid dependence, in remission: Secondary | ICD-10-CM | POA: Diagnosis not present

## 2015-06-15 DIAGNOSIS — F909 Attention-deficit hyperactivity disorder, unspecified type: Secondary | ICD-10-CM | POA: Diagnosis not present

## 2015-06-15 MED FILL — DEXTROAMP-AMPHET ER 20 MG C: 20 | 30 days supply | Qty: 60 | Fill #0

## 2015-06-16 DIAGNOSIS — F17200 Nicotine dependence, unspecified, uncomplicated: Secondary | ICD-10-CM | POA: Diagnosis not present

## 2015-06-16 DIAGNOSIS — F909 Attention-deficit hyperactivity disorder, unspecified type: Secondary | ICD-10-CM | POA: Diagnosis not present

## 2015-06-16 DIAGNOSIS — F1121 Opioid dependence, in remission: Secondary | ICD-10-CM | POA: Diagnosis not present

## 2015-06-21 DIAGNOSIS — F1121 Opioid dependence, in remission: Secondary | ICD-10-CM | POA: Diagnosis not present

## 2015-06-21 DIAGNOSIS — Z7151 Drug abuse counseling and surveillance of drug abuser: Secondary | ICD-10-CM | POA: Diagnosis not present

## 2015-07-13 DIAGNOSIS — F1121 Opioid dependence, in remission: Secondary | ICD-10-CM | POA: Diagnosis not present

## 2015-07-13 DIAGNOSIS — F909 Attention-deficit hyperactivity disorder, unspecified type: Secondary | ICD-10-CM | POA: Diagnosis not present

## 2015-07-13 DIAGNOSIS — F17200 Nicotine dependence, unspecified, uncomplicated: Secondary | ICD-10-CM | POA: Diagnosis not present

## 2015-07-13 MED FILL — DEXTROAMP-AMPHET ER 20 MG C: 20 | 30 days supply | Qty: 60 | Fill #0

## 2015-08-03 DIAGNOSIS — F17200 Nicotine dependence, unspecified, uncomplicated: Secondary | ICD-10-CM | POA: Diagnosis not present

## 2015-08-03 DIAGNOSIS — F909 Attention-deficit hyperactivity disorder, unspecified type: Secondary | ICD-10-CM | POA: Diagnosis not present

## 2015-08-03 DIAGNOSIS — F1121 Opioid dependence, in remission: Secondary | ICD-10-CM | POA: Diagnosis not present

## 2015-08-04 DIAGNOSIS — Z7151 Drug abuse counseling and surveillance of drug abuser: Secondary | ICD-10-CM | POA: Diagnosis not present

## 2015-08-04 DIAGNOSIS — F17200 Nicotine dependence, unspecified, uncomplicated: Secondary | ICD-10-CM | POA: Diagnosis not present

## 2015-08-04 DIAGNOSIS — F909 Attention-deficit hyperactivity disorder, unspecified type: Secondary | ICD-10-CM | POA: Diagnosis not present

## 2015-08-04 DIAGNOSIS — F1121 Opioid dependence, in remission: Secondary | ICD-10-CM | POA: Diagnosis not present

## 2015-08-09 IMAGING — RF DG MYELOGRAM CERVICAL
9 series · 9 of 9 positions shown · non-contrast
Comparison: 06/02/2014 cervical spine MRI

CLINICAL DATA: Neck pain for 6 months, progressively worsening in
the past month. Prior cervical fusion.
TECHNIQUE: Contiguous axial images were obtained through the Cervical spine
after the intrathecal infusion of contrast. Coronal and sagittal
reconstructions were obtained of the axial image sets.

[Series 1: cp_standard · 0.31mm/px · 1 of 1 slices shown (1 of 2)]
[im 1/1]
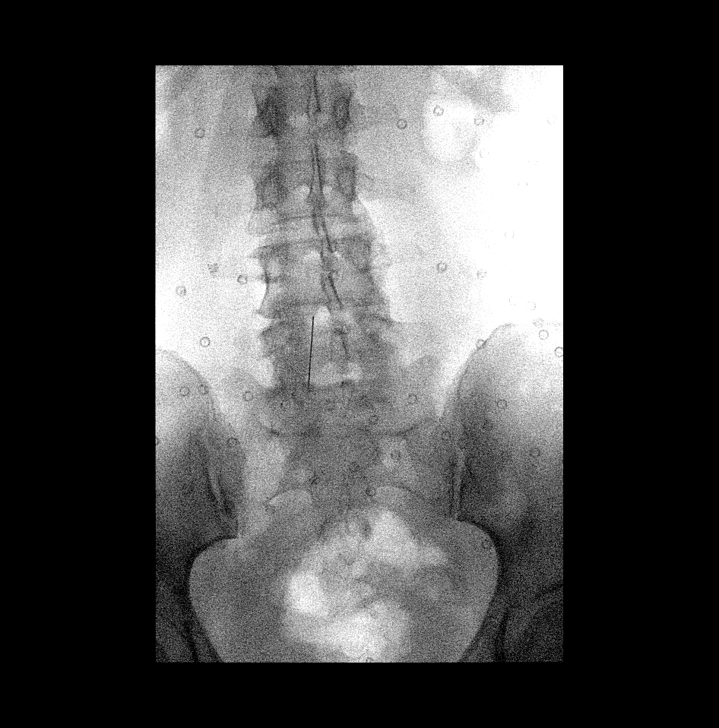

[Series 2: cp_standard · 0.31mm/px · 1 of 1 slices shown (2 of 2)]
[im 1/1]
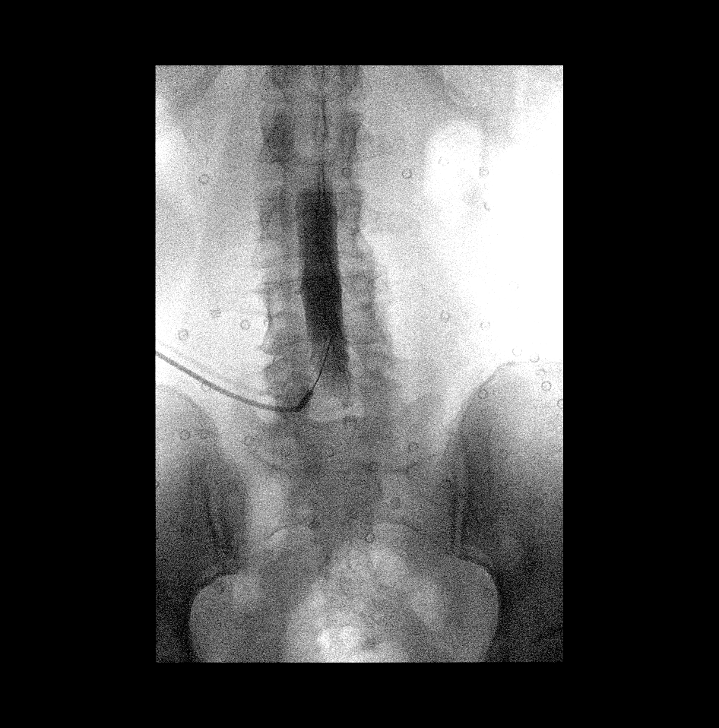

[Series 3: fluoro_myelogram_singleshot_bw · 0.20mm/px · 1 of 1 slices shown (1 of 6)]
[im 1/1]
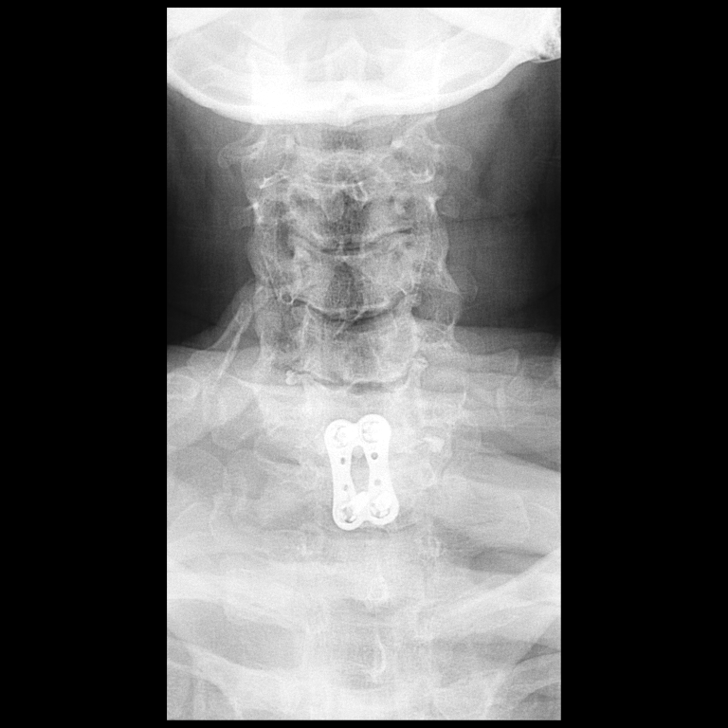

[Series 4: fluoro_myelogram_singleshot_bw · 0.20mm/px · 1 of 1 slices shown (2 of 6)]
[im 1/1]
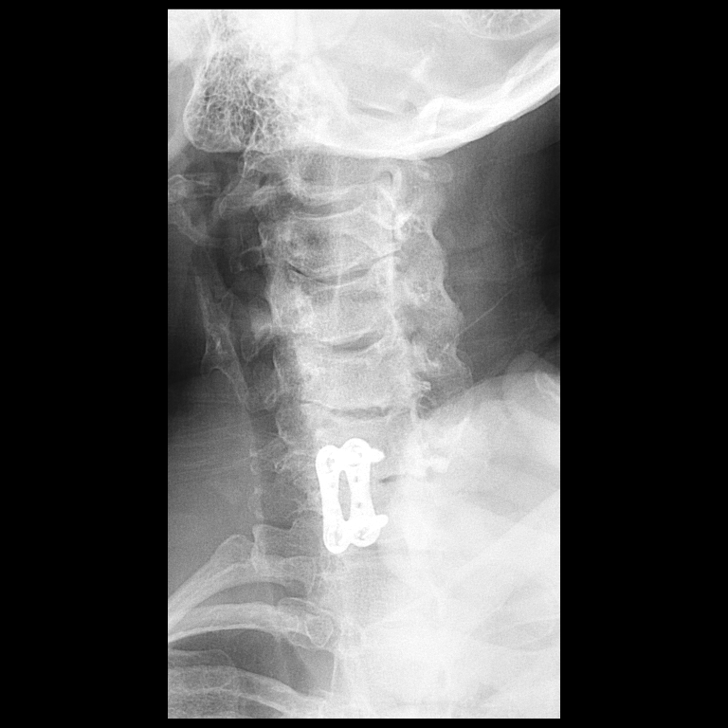

[Series 5: fluoro_myelogram_singleshot_bw · 0.20mm/px · 1 of 1 slices shown (3 of 6)]
[im 1/1]
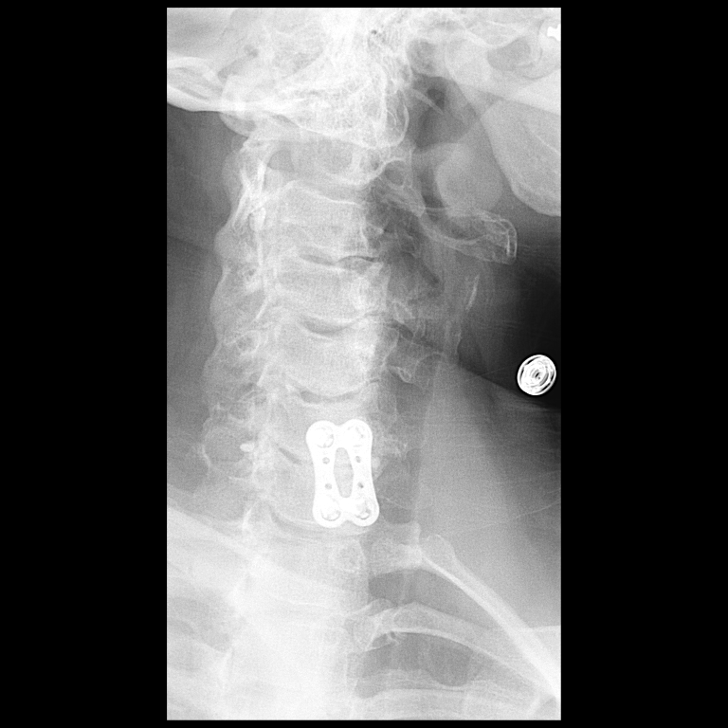

[Series 6: fluoro_myelogram_singleshot_bw · 0.21mm/px · 1 of 1 slices shown (4 of 6)]
[im 1/1]
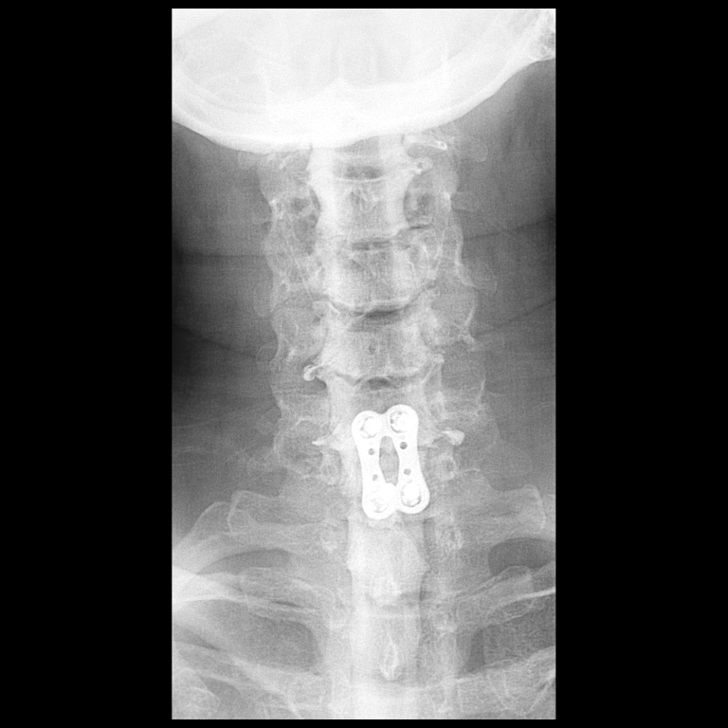

[Series 7: fluoro_myelogram_singleshot_bw · 0.21mm/px · 1 of 1 slices shown (5 of 6)]
[im 1/1]
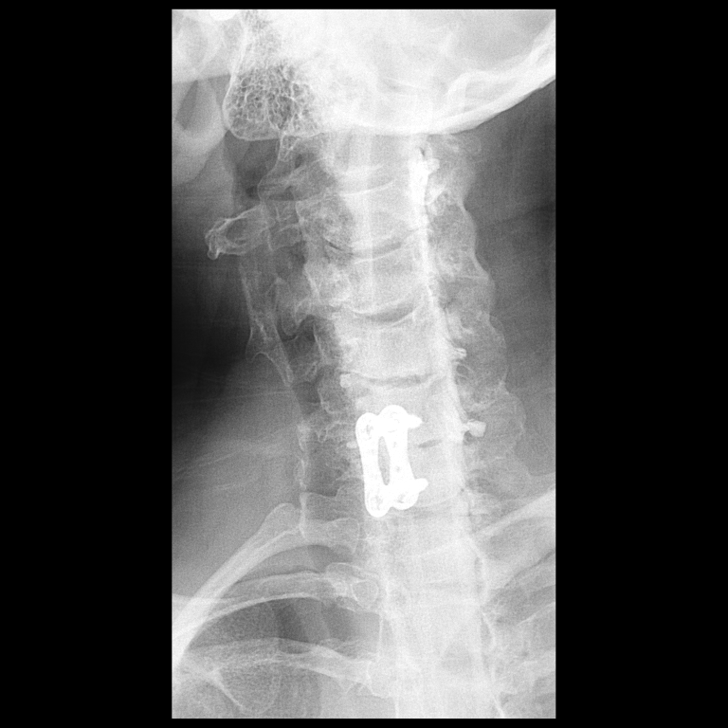

[Series 8: fluoro_myelogram_singleshot_bw · 0.21mm/px · 1 of 1 slices shown (6 of 6)]
[im 1/1]
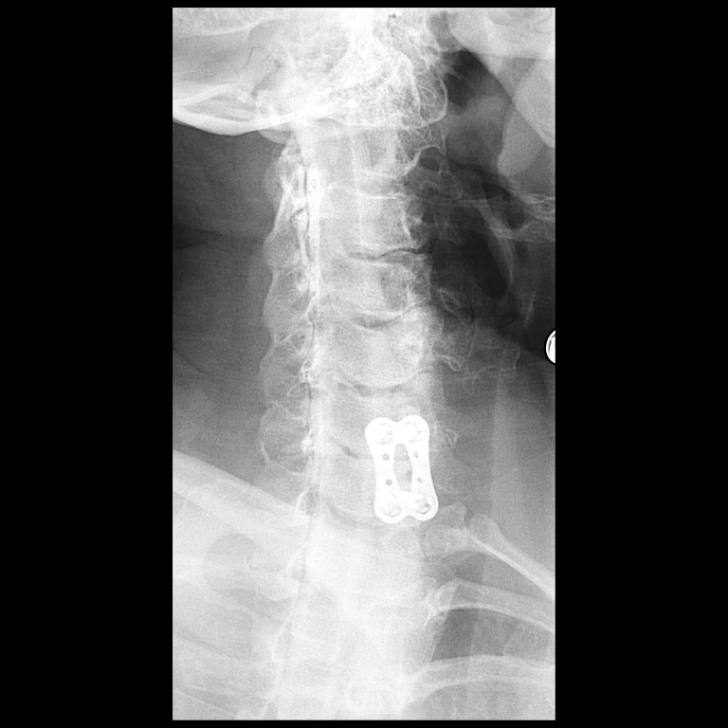

[Series 9: x cervical spine lat · 0.15mm/px · 1 of 1 slices shown]
[im 1/1]
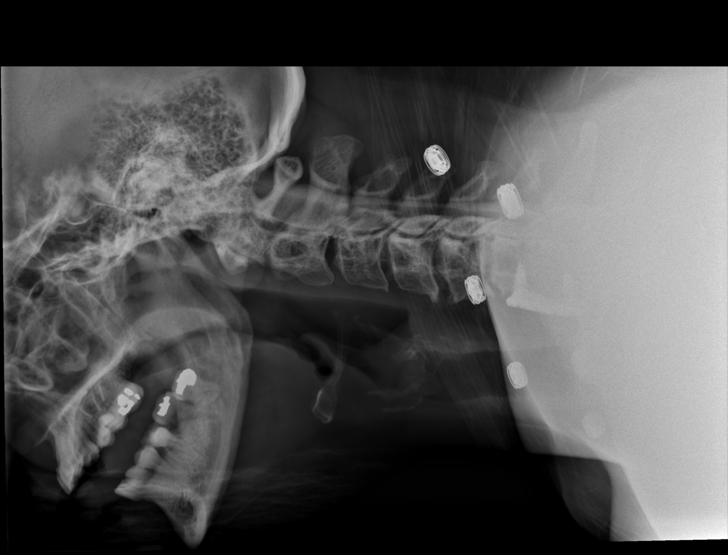

[9 of 9 positions shown; findings below may reference images not displayed]

FLUOROSCOPY TIME:  Radiation Exposure Index (as provided by the
fluoroscopic device): 220.69 Gy*cm2

PROCEDURE:
Lumbar puncture and intrathecal contrast administration were
performed by Dr. Viljan who will separately report for the portion
of the procedure. I personally supervised acquisition of the
myelogram images.
FINDINGS: CERVICAL MYELOGRAM FINDINGS:

Prior C6-7 ACDF is noted. There is no significant listhesis. There
is mild disc space narrowing at C3-4 and C4-5 with moderate to
severe narrowing at C5-6. Endplate osteophytosis is present at C4-5
and C5-6. There are small ventral extradural defects from C2-3 to
C4-5 with likely mild spinal stenosis at C3-4 and C4-5.

CT CERVICAL MYELOGRAM FINDINGS:

Sequelae of C6-7 ACDF are again identified. There is osseous fusion
across the anterior, central portion of the disc space. Slight
retrolisthesis of C4 on C5 measures 2 mm. Vertebral body heights are
preserved. There is mild disc space narrowing at C3-4 and C4-5 and
mild-to-moderate narrowing at C7-T1. Severe disc space narrowing
endplate irregularity are present at C5-6 with old screw tracts
noted in the C5 vertebral body, compatible with prior ACDF and
pseudarthrosis. Cervical spinal cord is normal in caliber.
Paraspinal soft tissues are unremarkable.

C2-3:  Mild right uncovertebral spurring without stenosis.

C3-4: Disc bulging results in mild spinal stenosis without mass
effect on the cord, unchanged. Uncovertebral spurring results in
moderate to severe bilateral neural foraminal stenosis, similar to
prior MRI.

C4-5: Disc bulging results in moderate spinal stenosis with at most
minimal impression on the ventral spinal cord, unchanged.
Uncovertebral spurring results in moderate right and
mild-to-moderate left neural foraminal stenosis, also unchanged.

C5-6: Prior ACDF. Mild uncovertebral spurring without significant
stenosis.

C6-7: Prior ACDF. Mild left uncovertebral spurring without
significant residual stenosis.

C7-T1: Uncovertebral hypertrophy/bilateral foraminal disc osteophyte
complexes result in moderate bilateral neural foraminal stenosis
without spinal stenosis, unchanged.
IMPRESSION: 1. Prior C5-6 and C6-7 ACDF without residual stenosis.
2. Mild spinal stenosis at C3-4 and moderate spinal stenosis at C4-5
due to disc bulging.
3. Moderate to severe bilateral neural foraminal stenosis at C3-4.
4. Moderate neural foraminal stenosis on the right at C4-5 and
bilaterally at C7-T1.

## 2015-08-10 MED FILL — DEXTROAMP-AMPHET ER 20 MG C: 20 | 30 days supply | Qty: 60 | Fill #0

## 2015-08-11 DIAGNOSIS — F1010 Alcohol abuse, uncomplicated: Secondary | ICD-10-CM | POA: Diagnosis not present

## 2015-08-11 DIAGNOSIS — Z7151 Drug abuse counseling and surveillance of drug abuser: Secondary | ICD-10-CM | POA: Diagnosis not present

## 2015-08-11 DIAGNOSIS — F1121 Opioid dependence, in remission: Secondary | ICD-10-CM | POA: Diagnosis not present

## 2015-08-30 DIAGNOSIS — M79601 Pain in right arm: Secondary | ICD-10-CM | POA: Diagnosis not present

## 2015-08-30 DIAGNOSIS — M542 Cervicalgia: Secondary | ICD-10-CM | POA: Diagnosis not present

## 2015-08-30 DIAGNOSIS — R202 Paresthesia of skin: Secondary | ICD-10-CM | POA: Diagnosis not present

## 2015-08-30 DIAGNOSIS — M5412 Radiculopathy, cervical region: Secondary | ICD-10-CM | POA: Diagnosis not present

## 2015-08-31 ENCOUNTER — Other Ambulatory Visit (HOSPITAL_COMMUNITY): Payer: Self-pay | Admitting: Family Medicine

## 2015-08-31 DIAGNOSIS — F909 Attention-deficit hyperactivity disorder, unspecified type: Secondary | ICD-10-CM | POA: Diagnosis not present

## 2015-08-31 DIAGNOSIS — F17200 Nicotine dependence, unspecified, uncomplicated: Secondary | ICD-10-CM | POA: Diagnosis not present

## 2015-08-31 DIAGNOSIS — M542 Cervicalgia: Secondary | ICD-10-CM

## 2015-08-31 DIAGNOSIS — F1121 Opioid dependence, in remission: Secondary | ICD-10-CM | POA: Diagnosis not present

## 2015-08-31 DIAGNOSIS — M5412 Radiculopathy, cervical region: Secondary | ICD-10-CM

## 2015-09-01 DIAGNOSIS — Z7151 Drug abuse counseling and surveillance of drug abuser: Secondary | ICD-10-CM | POA: Diagnosis not present

## 2015-09-01 DIAGNOSIS — F1121 Opioid dependence, in remission: Secondary | ICD-10-CM | POA: Diagnosis not present

## 2015-09-07 MED FILL — DEXTROAMP-AMPHET ER 20 MG C: 20 | 30 days supply | Qty: 60 | Fill #0

## 2015-09-08 ENCOUNTER — Ambulatory Visit (HOSPITAL_COMMUNITY)
Admission: RE | Admit: 2015-09-08 | Discharge: 2015-09-08 | Disposition: A | Discharge status: Home / Self Care | Payer: 59 | Source: Ambulatory Visit | Attending: Family Medicine | Admitting: Family Medicine

## 2015-09-08 DIAGNOSIS — M5412 Radiculopathy, cervical region: Secondary | ICD-10-CM | POA: Diagnosis not present

## 2015-09-08 DIAGNOSIS — M50323 Other cervical disc degeneration at C6-C7 level: Secondary | ICD-10-CM | POA: Diagnosis not present

## 2015-09-08 DIAGNOSIS — M542 Cervicalgia: Secondary | ICD-10-CM

## 2015-09-08 DIAGNOSIS — F1121 Opioid dependence, in remission: Secondary | ICD-10-CM | POA: Diagnosis not present

## 2015-09-08 DIAGNOSIS — M47892 Other spondylosis, cervical region: Secondary | ICD-10-CM | POA: Diagnosis not present

## 2015-09-08 DIAGNOSIS — M50321 Other cervical disc degeneration at C4-C5 level: Secondary | ICD-10-CM | POA: Diagnosis not present

## 2015-09-08 DIAGNOSIS — Z7151 Drug abuse counseling and surveillance of drug abuser: Secondary | ICD-10-CM | POA: Diagnosis not present

## 2015-09-20 DIAGNOSIS — Z716 Tobacco abuse counseling: Secondary | ICD-10-CM | POA: Diagnosis not present

## 2015-09-20 DIAGNOSIS — M5412 Radiculopathy, cervical region: Secondary | ICD-10-CM | POA: Diagnosis not present

## 2015-09-28 DIAGNOSIS — M5412 Radiculopathy, cervical region: Secondary | ICD-10-CM | POA: Diagnosis not present

## 2015-10-06 DIAGNOSIS — Z7151 Drug abuse counseling and surveillance of drug abuser: Secondary | ICD-10-CM | POA: Diagnosis not present

## 2015-10-06 DIAGNOSIS — F1121 Opioid dependence, in remission: Secondary | ICD-10-CM | POA: Diagnosis not present

## 2015-10-06 MED FILL — DEXTROAMP-AMPHET ER 20 MG C: 20 | 30 days supply | Qty: 60 | Fill #0

## 2015-10-12 DIAGNOSIS — F909 Attention-deficit hyperactivity disorder, unspecified type: Secondary | ICD-10-CM | POA: Diagnosis not present

## 2015-10-12 DIAGNOSIS — F1121 Opioid dependence, in remission: Secondary | ICD-10-CM | POA: Diagnosis not present

## 2015-10-12 DIAGNOSIS — F17200 Nicotine dependence, unspecified, uncomplicated: Secondary | ICD-10-CM | POA: Diagnosis not present

## 2015-10-13 DIAGNOSIS — F1121 Opioid dependence, in remission: Secondary | ICD-10-CM | POA: Diagnosis not present

## 2015-10-13 DIAGNOSIS — Z7151 Drug abuse counseling and surveillance of drug abuser: Secondary | ICD-10-CM | POA: Diagnosis not present

## 2015-10-20 DIAGNOSIS — F1121 Opioid dependence, in remission: Secondary | ICD-10-CM | POA: Diagnosis not present

## 2015-10-20 DIAGNOSIS — Z7151 Drug abuse counseling and surveillance of drug abuser: Secondary | ICD-10-CM | POA: Diagnosis not present

## 2015-10-20 MED FILL — traMADol HCL 50 MG TABS: 50 | 30 days supply | Qty: 60 | Fill #0

## 2015-11-01 DIAGNOSIS — M5412 Radiculopathy, cervical region: Secondary | ICD-10-CM | POA: Diagnosis not present

## 2015-11-02 DIAGNOSIS — F909 Attention-deficit hyperactivity disorder, unspecified type: Secondary | ICD-10-CM | POA: Diagnosis not present

## 2015-11-02 DIAGNOSIS — F1121 Opioid dependence, in remission: Secondary | ICD-10-CM | POA: Diagnosis not present

## 2015-11-02 DIAGNOSIS — F17200 Nicotine dependence, unspecified, uncomplicated: Secondary | ICD-10-CM | POA: Diagnosis not present

## 2015-11-03 MED FILL — DEXTROAMP-AMPHET ER 20 MG C: 20 | 30 days supply | Qty: 60 | Fill #0

## 2015-11-10 DIAGNOSIS — Z7151 Drug abuse counseling and surveillance of drug abuser: Secondary | ICD-10-CM | POA: Diagnosis not present

## 2015-11-10 DIAGNOSIS — F1121 Opioid dependence, in remission: Secondary | ICD-10-CM | POA: Diagnosis not present

## 2015-11-17 MED FILL — traMADol HCL 50 MG TABS: 50 | 30 days supply | Qty: 60 | Fill #0

## 2015-11-30 DIAGNOSIS — F909 Attention-deficit hyperactivity disorder, unspecified type: Secondary | ICD-10-CM | POA: Diagnosis not present

## 2015-11-30 DIAGNOSIS — F1121 Opioid dependence, in remission: Secondary | ICD-10-CM | POA: Diagnosis not present

## 2015-11-30 DIAGNOSIS — E669 Obesity, unspecified: Secondary | ICD-10-CM | POA: Diagnosis not present

## 2015-11-30 DIAGNOSIS — F17200 Nicotine dependence, unspecified, uncomplicated: Secondary | ICD-10-CM | POA: Diagnosis not present

## 2015-12-01 DIAGNOSIS — Z7151 Drug abuse counseling and surveillance of drug abuser: Secondary | ICD-10-CM | POA: Diagnosis not present

## 2015-12-01 DIAGNOSIS — F1121 Opioid dependence, in remission: Secondary | ICD-10-CM | POA: Diagnosis not present

## 2015-12-01 MED FILL — DEXTROAMP-AMPHET ER 20 MG C: 20 | 30 days supply | Qty: 60 | Fill #0

## 2015-12-15 MED FILL — traMADol HCL 50 MG TABS: 50 | 15 days supply | Qty: 30 | Fill #0

## 2015-12-28 DIAGNOSIS — F909 Attention-deficit hyperactivity disorder, unspecified type: Secondary | ICD-10-CM | POA: Diagnosis not present

## 2015-12-28 DIAGNOSIS — F17200 Nicotine dependence, unspecified, uncomplicated: Secondary | ICD-10-CM | POA: Diagnosis not present

## 2015-12-28 DIAGNOSIS — F1121 Opioid dependence, in remission: Secondary | ICD-10-CM | POA: Diagnosis not present

## 2015-12-29 DIAGNOSIS — F1121 Opioid dependence, in remission: Secondary | ICD-10-CM | POA: Diagnosis not present

## 2015-12-29 DIAGNOSIS — Z7151 Drug abuse counseling and surveillance of drug abuser: Secondary | ICD-10-CM | POA: Diagnosis not present

## 2015-12-29 MED FILL — DEXTROAMP-AMPHET ER 20 MG C: 20 | 30 days supply | Qty: 60 | Fill #0

## 2016-01-05 DIAGNOSIS — Z7151 Drug abuse counseling and surveillance of drug abuser: Secondary | ICD-10-CM | POA: Diagnosis not present

## 2016-01-05 DIAGNOSIS — F1121 Opioid dependence, in remission: Secondary | ICD-10-CM | POA: Diagnosis not present

## 2016-01-11 MED FILL — traMADol HCL 50 MG TABS: 50 | 15 days supply | Qty: 30 | Fill #0

## 2016-01-18 DIAGNOSIS — J209 Acute bronchitis, unspecified: Secondary | ICD-10-CM | POA: Diagnosis not present

## 2016-01-18 DIAGNOSIS — F17210 Nicotine dependence, cigarettes, uncomplicated: Secondary | ICD-10-CM | POA: Diagnosis not present

## 2016-01-18 DIAGNOSIS — Z6834 Body mass index (BMI) 34.0-34.9, adult: Secondary | ICD-10-CM | POA: Diagnosis not present

## 2016-01-25 DIAGNOSIS — F1121 Opioid dependence, in remission: Secondary | ICD-10-CM | POA: Diagnosis not present

## 2016-01-25 DIAGNOSIS — F909 Attention-deficit hyperactivity disorder, unspecified type: Secondary | ICD-10-CM | POA: Diagnosis not present

## 2016-01-25 DIAGNOSIS — F17200 Nicotine dependence, unspecified, uncomplicated: Secondary | ICD-10-CM | POA: Diagnosis not present

## 2016-01-26 MED FILL — DEXTROAMP-AMPHET ER 20 MG C: 20 | 30 days supply | Qty: 60 | Fill #0

## 2016-02-05 DIAGNOSIS — Z23 Encounter for immunization: Secondary | ICD-10-CM | POA: Diagnosis not present

## 2016-02-15 MED FILL — traMADol HCL 50 MG TABS: 50 | 15 days supply | Qty: 30 | Fill #0

## 2016-02-22 DIAGNOSIS — F1120 Opioid dependence, uncomplicated: Secondary | ICD-10-CM | POA: Diagnosis not present

## 2016-02-22 DIAGNOSIS — F1121 Opioid dependence, in remission: Secondary | ICD-10-CM | POA: Diagnosis not present

## 2016-02-22 DIAGNOSIS — F909 Attention-deficit hyperactivity disorder, unspecified type: Secondary | ICD-10-CM | POA: Diagnosis not present

## 2016-02-23 MED FILL — DEXTROAMP-AMPHET ER 20 MG C: 20 | 30 days supply | Qty: 60 | Fill #0

## 2016-02-24 DIAGNOSIS — F1121 Opioid dependence, in remission: Secondary | ICD-10-CM | POA: Diagnosis not present

## 2016-02-24 DIAGNOSIS — F909 Attention-deficit hyperactivity disorder, unspecified type: Secondary | ICD-10-CM | POA: Diagnosis not present

## 2016-03-06 MED FILL — traMADol HCL 50 MG TABS: 50 | 15 days supply | Qty: 30 | Fill #0

## 2016-03-21 DIAGNOSIS — F1121 Opioid dependence, in remission: Secondary | ICD-10-CM | POA: Diagnosis not present

## 2016-03-21 DIAGNOSIS — F17200 Nicotine dependence, unspecified, uncomplicated: Secondary | ICD-10-CM | POA: Diagnosis not present

## 2016-03-21 DIAGNOSIS — F909 Attention-deficit hyperactivity disorder, unspecified type: Secondary | ICD-10-CM | POA: Diagnosis not present

## 2016-03-21 MED FILL — traMADol HCL 50 MG TABS: 50 | 15 days supply | Qty: 30 | Fill #0

## 2016-03-22 MED FILL — DEXTROAMP-AMPHET ER 20 MG C: 20 | 30 days supply | Qty: 60 | Fill #0

## 2016-04-05 MED FILL — traMADol HCL 50 MG TABS: 50 | 15 days supply | Qty: 30 | Fill #0

## 2016-04-18 DIAGNOSIS — F17200 Nicotine dependence, unspecified, uncomplicated: Secondary | ICD-10-CM | POA: Diagnosis not present

## 2016-04-18 DIAGNOSIS — F909 Attention-deficit hyperactivity disorder, unspecified type: Secondary | ICD-10-CM | POA: Diagnosis not present

## 2016-04-18 DIAGNOSIS — E669 Obesity, unspecified: Secondary | ICD-10-CM | POA: Diagnosis not present

## 2016-04-18 DIAGNOSIS — F1121 Opioid dependence, in remission: Secondary | ICD-10-CM | POA: Diagnosis not present

## 2016-04-18 MED FILL — traMADol HCL 50 MG TABS: 50 | 15 days supply | Qty: 30 | Fill #0

## 2016-04-19 MED FILL — ADDERALL XR 20 MG CAP SA: 20 | 30 days supply | Qty: 60 | Fill #0

## 2016-05-01 ENCOUNTER — Ambulatory Visit (INDEPENDENT_AMBULATORY_CARE_PROVIDER_SITE_OTHER): Payer: 59 | Admitting: Adult Health

## 2016-05-01 ENCOUNTER — Encounter: Payer: Self-pay | Admitting: Adult Health

## 2016-05-01 VITALS — BP 144/78 | HR 95 | Ht 65.5 in | Wt 225.0 lb

## 2016-05-01 DIAGNOSIS — Z01411 Encounter for gynecological examination (general) (routine) with abnormal findings: Secondary | ICD-10-CM | POA: Diagnosis not present

## 2016-05-01 DIAGNOSIS — Z1211 Encounter for screening for malignant neoplasm of colon: Secondary | ICD-10-CM

## 2016-05-01 DIAGNOSIS — Z01419 Encounter for gynecological examination (general) (routine) without abnormal findings: Secondary | ICD-10-CM

## 2016-05-01 DIAGNOSIS — N6320 Unspecified lump in the left breast, unspecified quadrant: Secondary | ICD-10-CM

## 2016-05-01 DIAGNOSIS — Z1212 Encounter for screening for malignant neoplasm of rectum: Secondary | ICD-10-CM

## 2016-05-01 LAB — HEMOCCULT GUIAC POC 1CARD (OFFICE): FECAL OCCULT BLD: NEGATIVE

## 2016-05-01 NOTE — Patient Instructions (Addendum)
Mammogram 1/23 at 10:45 at [facility] Physical in 1 year Labs fasting next week before mammogram

## 2016-05-01 NOTE — Progress Notes (Signed)
Patient ID: [name], female   DOB: [birth year], 41 y.o.   MRN: [id number] History of Present Illness: [name] is a 41 year old white female in for well woman gyn exam, she is sp hysterectomy.She says breast mass on left feels bigger and tender.  PCP is [facility].    Current Medications, Allergies, Past Medical History, Past Surgical History, Family History and Social History were reviewed in [employer] record.     Review of Systems: Patient denies any headaches, hearing loss, fatigue, blurred vision, shortness of breath, chest pain, abdominal pain, problems with bowel movements, urination, or intercourse. No joint pain or mood swings.+breast mass on left feels better and tender, and will have greenish discharge if squeezes it.Has had biopsy in past an has clip in place, she says.    Physical Exam:BP (!) 144/78 (BP Location: Left Arm, Patient Position: Sitting, Cuff Size: Large)   Pulse 95   Ht 5' 5.5" (1.664 m)   Wt 225 lb (102.1 kg)   BMI 36.87 kg/m  General:  Well developed, well nourished, no acute distress Skin:  Warm and dry Neck:  Midline trachea, normal thyroid, good ROM, no lymphadenopathy Lungs; Clear to auscultation bilaterally Breast:  No dominant palpable mass, retraction, or nipple discharge on right, on left no retraction or nipple discharge but has tender mobile, 2 cm oval mass at 1 0'clock 4 fb from areola  Cardiovascular: Regular rate and rhythm Abdomen:  Soft, non tender, no hepatosplenomegaly Pelvic:  External genitalia is normal in appearance, no lesions.  The vagina is normal in appearance. Urethra has no lesions or masses. The cervix and uterus are absent.  No adnexal masses or tenderness noted.Bladder is non tender, no masses felt. Rectal: Good sphincter tone, no polyps, or hemorrhoids felt.  Hemoccult negative. Extremities/musculoskeletal:  No swelling or varicosities noted, no clubbing or cyanosis Psych:  No mood changes, alert and  cooperative,seems happy PHQ 2 score 0.  Impression: 1. Well woman exam with routine gynecological exam   2. Left breast mass   3. Screening for colorectal cancer       Plan: Check CBC,CMP,TSH and lipids,A1c and vitamin D  Bilateral diagnostic mammogram and [place] 1/23 at [facility] at 10:45 Physical in 1 year

## 2016-05-08 MED FILL — traMADol HCL 50 MG TABS: 50 | 30 days supply | Qty: 60 | Fill #0

## 2016-05-09 ENCOUNTER — Ambulatory Visit (HOSPITAL_COMMUNITY)
Admission: RE | Admit: 2016-05-09 | Discharge: 2016-05-09 | Disposition: A | Discharge status: Home / Self Care | Payer: 59 | Source: Ambulatory Visit | Attending: Adult Health | Admitting: Adult Health

## 2016-05-09 DIAGNOSIS — D242 Benign neoplasm of left breast: Secondary | ICD-10-CM | POA: Diagnosis not present

## 2016-05-09 DIAGNOSIS — R922 Inconclusive mammogram: Secondary | ICD-10-CM | POA: Diagnosis not present

## 2016-05-09 DIAGNOSIS — N6320 Unspecified lump in the left breast, unspecified quadrant: Secondary | ICD-10-CM | POA: Diagnosis present

## 2016-05-16 DIAGNOSIS — F17200 Nicotine dependence, unspecified, uncomplicated: Secondary | ICD-10-CM | POA: Diagnosis not present

## 2016-05-16 DIAGNOSIS — F1121 Opioid dependence, in remission: Secondary | ICD-10-CM | POA: Diagnosis not present

## 2016-05-16 DIAGNOSIS — F1120 Opioid dependence, uncomplicated: Secondary | ICD-10-CM | POA: Diagnosis not present

## 2016-05-16 DIAGNOSIS — F909 Attention-deficit hyperactivity disorder, unspecified type: Secondary | ICD-10-CM | POA: Diagnosis not present

## 2016-05-16 MED FILL — ADDERALL XR 30 MG CAP SA: 30 | 30 days supply | Qty: 60 | Fill #0

## 2016-06-13 DIAGNOSIS — F909 Attention-deficit hyperactivity disorder, unspecified type: Secondary | ICD-10-CM | POA: Diagnosis not present

## 2016-06-13 DIAGNOSIS — F1121 Opioid dependence, in remission: Secondary | ICD-10-CM | POA: Diagnosis not present

## 2016-06-13 DIAGNOSIS — F17200 Nicotine dependence, unspecified, uncomplicated: Secondary | ICD-10-CM | POA: Diagnosis not present

## 2016-06-13 MED FILL — ADDERALL XR 30 MG CAP SA: 30 | 30 days supply | Qty: 60 | Fill #0

## 2016-06-14 DIAGNOSIS — F1120 Opioid dependence, uncomplicated: Secondary | ICD-10-CM | POA: Diagnosis not present

## 2016-07-11 DIAGNOSIS — F909 Attention-deficit hyperactivity disorder, unspecified type: Secondary | ICD-10-CM | POA: Diagnosis not present

## 2016-07-11 DIAGNOSIS — F17200 Nicotine dependence, unspecified, uncomplicated: Secondary | ICD-10-CM | POA: Diagnosis not present

## 2016-07-11 DIAGNOSIS — F1121 Opioid dependence, in remission: Secondary | ICD-10-CM | POA: Diagnosis not present

## 2016-07-11 MED FILL — ADDERALL XR 30 MG CAP SA: 30 | 30 days supply | Qty: 60 | Fill #0

## 2016-08-08 DIAGNOSIS — F17200 Nicotine dependence, unspecified, uncomplicated: Secondary | ICD-10-CM | POA: Diagnosis not present

## 2016-08-08 DIAGNOSIS — F909 Attention-deficit hyperactivity disorder, unspecified type: Secondary | ICD-10-CM | POA: Diagnosis not present

## 2016-08-08 DIAGNOSIS — F1121 Opioid dependence, in remission: Secondary | ICD-10-CM | POA: Diagnosis not present

## 2016-08-08 MED FILL — ADDERALL XR 30 MG CAP SA: 30 | 30 days supply | Qty: 60 | Fill #0

## 2016-09-05 DIAGNOSIS — F17200 Nicotine dependence, unspecified, uncomplicated: Secondary | ICD-10-CM | POA: Diagnosis not present

## 2016-09-05 DIAGNOSIS — F909 Attention-deficit hyperactivity disorder, unspecified type: Secondary | ICD-10-CM | POA: Diagnosis not present

## 2016-09-05 DIAGNOSIS — F1121 Opioid dependence, in remission: Secondary | ICD-10-CM | POA: Diagnosis not present

## 2016-09-05 MED FILL — ADDERALL XR 30 MG CAP SA: 30 | 30 days supply | Qty: 60 | Fill #0

## 2016-10-03 MED FILL — ADDERALL XR 30 MG CAP SA: 30 | 30 days supply | Qty: 60 | Fill #0

## 2016-11-02 MED FILL — ADDERALL XR 30 MG CAP SA: 30 | 30 days supply | Qty: 60 | Fill #0

## 2016-11-30 MED FILL — ADDERALL XR 30 MG CAP SA: 30 | 30 days supply | Qty: 60 | Fill #0

## 2017-01-01 MED FILL — ADDERALL XR 30 MG CAP SA: 30 | 30 days supply | Qty: 60 | Fill #0

## 2017-01-30 MED FILL — ADDERALL XR 30 MG CAP SA: 30 | 30 days supply | Qty: 60 | Fill #0

## 2017-02-01 DIAGNOSIS — Z23 Encounter for immunization: Secondary | ICD-10-CM | POA: Diagnosis not present

## 2017-02-05 DIAGNOSIS — F1120 Opioid dependence, uncomplicated: Secondary | ICD-10-CM | POA: Diagnosis not present

## 2017-02-05 DIAGNOSIS — F17200 Nicotine dependence, unspecified, uncomplicated: Secondary | ICD-10-CM | POA: Diagnosis not present

## 2017-02-05 DIAGNOSIS — F909 Attention-deficit hyperactivity disorder, unspecified type: Secondary | ICD-10-CM | POA: Diagnosis not present

## 2017-02-05 DIAGNOSIS — F1121 Opioid dependence, in remission: Secondary | ICD-10-CM | POA: Diagnosis not present

## 2017-02-26 DIAGNOSIS — F1120 Opioid dependence, uncomplicated: Secondary | ICD-10-CM | POA: Diagnosis not present

## 2017-02-26 DIAGNOSIS — F17200 Nicotine dependence, unspecified, uncomplicated: Secondary | ICD-10-CM | POA: Diagnosis not present

## 2017-02-26 DIAGNOSIS — F909 Attention-deficit hyperactivity disorder, unspecified type: Secondary | ICD-10-CM | POA: Diagnosis not present

## 2017-02-26 DIAGNOSIS — F1121 Opioid dependence, in remission: Secondary | ICD-10-CM | POA: Diagnosis not present

## 2017-02-27 MED FILL — ADDERALL XR 30 MG CAP SA: 30 | 30 days supply | Qty: 60 | Fill #0

## 2017-03-27 DIAGNOSIS — F1120 Opioid dependence, uncomplicated: Secondary | ICD-10-CM | POA: Diagnosis not present

## 2017-03-27 DIAGNOSIS — F17200 Nicotine dependence, unspecified, uncomplicated: Secondary | ICD-10-CM | POA: Diagnosis not present

## 2017-03-27 DIAGNOSIS — F1121 Opioid dependence, in remission: Secondary | ICD-10-CM | POA: Diagnosis not present

## 2017-03-27 DIAGNOSIS — F909 Attention-deficit hyperactivity disorder, unspecified type: Secondary | ICD-10-CM | POA: Diagnosis not present

## 2017-03-27 MED FILL — ADDERALL XR 30 MG CAP SA: 30 | 30 days supply | Qty: 60 | Fill #0

## 2017-04-30 DIAGNOSIS — F1121 Opioid dependence, in remission: Secondary | ICD-10-CM | POA: Diagnosis not present

## 2017-04-30 DIAGNOSIS — F909 Attention-deficit hyperactivity disorder, unspecified type: Secondary | ICD-10-CM | POA: Diagnosis not present

## 2017-04-30 MED FILL — ADDERALL XR 30 MG CAP SA: 30 | 30 days supply | Qty: 60 | Fill #0

## 2017-05-28 DIAGNOSIS — F1121 Opioid dependence, in remission: Secondary | ICD-10-CM | POA: Diagnosis not present

## 2017-05-28 DIAGNOSIS — F909 Attention-deficit hyperactivity disorder, unspecified type: Secondary | ICD-10-CM | POA: Diagnosis not present

## 2017-05-28 DIAGNOSIS — F1120 Opioid dependence, uncomplicated: Secondary | ICD-10-CM | POA: Diagnosis not present

## 2017-05-28 DIAGNOSIS — F17200 Nicotine dependence, unspecified, uncomplicated: Secondary | ICD-10-CM | POA: Diagnosis not present

## 2017-05-28 MED FILL — ADDERALL XR 30 MG CAP SA: 30 | 30 days supply | Qty: 60 | Fill #0

## 2017-06-26 MED FILL — ADDERALL XR 30 MG CAP SA: 30 | 30 days supply | Qty: 60 | Fill #0

## 2017-06-27 ENCOUNTER — Other Ambulatory Visit: Payer: Self-pay | Admitting: Adult Health

## 2017-06-27 DIAGNOSIS — Z1231 Encounter for screening mammogram for malignant neoplasm of breast: Secondary | ICD-10-CM

## 2017-06-28 ENCOUNTER — Telehealth: Payer: Self-pay | Admitting: Adult Health

## 2017-06-28 ENCOUNTER — Ambulatory Visit (HOSPITAL_COMMUNITY)
Admission: RE | Admit: 2017-06-28 | Discharge: 2017-06-28 | Disposition: A | Discharge status: Home / Self Care | Payer: 59 | Source: Ambulatory Visit | Attending: Adult Health | Admitting: Adult Health

## 2017-06-28 ENCOUNTER — Encounter (HOSPITAL_COMMUNITY): Payer: Self-pay

## 2017-06-28 DIAGNOSIS — Z1231 Encounter for screening mammogram for malignant neoplasm of breast: Secondary | ICD-10-CM

## 2017-06-28 NOTE — Telephone Encounter (Signed)
Left message to call tomorrow will work her in for appt in next week

## 2017-07-02 ENCOUNTER — Other Ambulatory Visit (HOSPITAL_COMMUNITY)
Admission: RE | Admit: 2017-07-02 | Discharge: 2017-07-02 | Disposition: A | Discharge status: Home / Self Care | Payer: 59 | Source: Ambulatory Visit | Attending: Adult Health | Admitting: Adult Health

## 2017-07-02 ENCOUNTER — Encounter: Payer: Self-pay | Admitting: Adult Health

## 2017-07-02 ENCOUNTER — Ambulatory Visit: Payer: 59 | Admitting: Adult Health

## 2017-07-02 VITALS — BP 130/80 | HR 96 | Ht 66.0 in | Wt 226.0 lb

## 2017-07-02 DIAGNOSIS — N6321 Unspecified lump in the left breast, upper outer quadrant: Secondary | ICD-10-CM | POA: Diagnosis not present

## 2017-07-02 DIAGNOSIS — N6452 Nipple discharge: Secondary | ICD-10-CM | POA: Insufficient documentation

## 2017-07-02 NOTE — Progress Notes (Signed)
Subjective:     Patient ID: [name], female   DOB: 1975-09-27, 42 y.o.   MRN: [id number]  HPI [name] is a 42 year old white female in complaining of went for mammogram Thursday and they would not do, due to her complaint of left nipple discharge for last 3-4 months.The discharge can be brown to bloody and has greenish discharge for site just above the bloody discharge, it is never spontaneous but when squeezed.   Review of Systems Left breast discharge for about 3-4 months is bloody to brown and green Reviewed past medical,surgical, social and family history. Reviewed medications and allergies.     Objective:   Physical Exam BP 130/80 (BP Location: Right Arm, Patient Position: Sitting, Cuff Size: Large)   Pulse 96   Ht 5\' 6"  (1.676 m)   Wt 226 lb (102.5 kg)   BMI 36.48 kg/m    PHQ 9 score 2.TSH and Prolactin checked before exam   Skin warm and dry,  Breasts:no dominate palpable mass, retraction or nipple discharge on right, on left has fading bruise inner left breast(from fall), and 3 cm mass at 1-2 o'clock 4 FB from nipple(has marker there, from prior biopsy) and has bloody discharge from 1 site when palpated and some greenish discharge from 1 site just above it, placed on slide to sent to pathology.  Will get diagnostic mammogram.  Assessment:     1. Nipple discharge   2. Mass of upper outer quadrant of left breast       Plan:     Checked TSH and Prolactin before exam Slide if discharge sent to pathology Diagnostic mammogram and [place] if needed, 3/19 at 1:30 pm Return in about a month for physical

## 2017-07-02 NOTE — Addendum Note (Signed)
Addended by: Diona Fanti A on: 07/02/2017 11:56 AM   Modules accepted: Orders

## 2017-07-03 ENCOUNTER — Ambulatory Visit (HOSPITAL_COMMUNITY)
Admission: RE | Admit: 2017-07-03 | Discharge: 2017-07-03 | Disposition: A | Discharge status: Home / Self Care | Payer: 59 | Source: Ambulatory Visit | Attending: Adult Health | Admitting: Adult Health

## 2017-07-03 ENCOUNTER — Other Ambulatory Visit: Payer: Self-pay | Admitting: Adult Health

## 2017-07-03 ENCOUNTER — Ambulatory Visit (HOSPITAL_COMMUNITY): Payer: 59

## 2017-07-03 DIAGNOSIS — N6321 Unspecified lump in the left breast, upper outer quadrant: Secondary | ICD-10-CM | POA: Insufficient documentation

## 2017-07-03 DIAGNOSIS — N6452 Nipple discharge: Secondary | ICD-10-CM

## 2017-07-03 DIAGNOSIS — R922 Inconclusive mammogram: Secondary | ICD-10-CM | POA: Diagnosis not present

## 2017-07-03 LAB — PROLACTIN: PROLACTIN: 5.3 ng/mL (ref 4.8–23.3)

## 2017-07-03 LAB — TSH: TSH: 1.88 u[IU]/mL (ref 0.450–4.500)

## 2017-07-17 ENCOUNTER — Ambulatory Visit (HOSPITAL_COMMUNITY)
Admission: RE | Admit: 2017-07-17 | Discharge: 2017-07-17 | Disposition: A | Discharge status: Home / Self Care | Payer: 59 | Source: Ambulatory Visit | Attending: Adult Health | Admitting: Adult Health

## 2017-07-17 ENCOUNTER — Ambulatory Visit (HOSPITAL_COMMUNITY)
Admission: RE | Admit: 2017-07-17 | Discharge: 2017-07-17 | Disposition: A | Discharge status: Home / Self Care | Payer: 59 | Source: Ambulatory Visit | Attending: Family Medicine | Admitting: Family Medicine

## 2017-07-17 ENCOUNTER — Other Ambulatory Visit (HOSPITAL_COMMUNITY): Payer: Self-pay | Admitting: Family Medicine

## 2017-07-17 DIAGNOSIS — N6452 Nipple discharge: Secondary | ICD-10-CM

## 2017-07-17 DIAGNOSIS — N6321 Unspecified lump in the left breast, upper outer quadrant: Secondary | ICD-10-CM | POA: Diagnosis present

## 2017-07-17 DIAGNOSIS — N6342 Unspecified lump in left breast, subareolar: Secondary | ICD-10-CM | POA: Diagnosis not present

## 2017-07-17 DIAGNOSIS — R928 Other abnormal and inconclusive findings on diagnostic imaging of breast: Secondary | ICD-10-CM

## 2017-07-17 DIAGNOSIS — D242 Benign neoplasm of left breast: Secondary | ICD-10-CM | POA: Insufficient documentation

## 2017-07-17 DIAGNOSIS — N6324 Unspecified lump in the left breast, lower inner quadrant: Secondary | ICD-10-CM | POA: Diagnosis not present

## 2017-07-17 DIAGNOSIS — N6322 Unspecified lump in the left breast, upper inner quadrant: Secondary | ICD-10-CM | POA: Diagnosis not present

## 2017-07-17 MED ORDER — LIDOCAINE HCL (PF) 1 % IJ SOLN
INTRAMUSCULAR | Status: AC
  Administered 2017-07-17: 5 mL
  Filled 2017-07-17: qty 5

## 2017-07-17 MED ORDER — LIDOCAINE-EPINEPHRINE (PF) 1 %-1:200000 IJ SOLN
INTRAMUSCULAR | Status: AC
  Administered 2017-07-17: 8 mL
  Filled 2017-07-17: qty 30

## 2017-07-20 ENCOUNTER — Telehealth: Payer: Self-pay | Admitting: Adult Health

## 2017-07-20 NOTE — Telephone Encounter (Signed)
Left message that biopsy negative for cancer but had papilloma, may want that out and breast center was going to call you

## 2017-08-02 ENCOUNTER — Encounter: Payer: Self-pay | Admitting: General Surgery

## 2017-08-02 ENCOUNTER — Ambulatory Visit: Payer: 59 | Admitting: General Surgery

## 2017-08-02 VITALS — BP 156/84 | HR 95 | Temp 96.4°F | Ht 66.0 in | Wt 220.0 lb

## 2017-08-02 DIAGNOSIS — N6320 Unspecified lump in the left breast, unspecified quadrant: Secondary | ICD-10-CM | POA: Diagnosis not present

## 2017-08-02 DIAGNOSIS — D242 Benign neoplasm of left breast: Secondary | ICD-10-CM

## 2017-08-02 NOTE — Patient Instructions (Signed)
Breast Biopsy  A breast biopsy is a procedure in which a sample of suspicious breast tissue is removed from your breast. Following the procedure, the tissue or liquid that is removed from the breast is examined under a microscope to see if cancerous cells are present. You may need a breast biopsy if you have:  · Any undiagnosed breast mass (tumor).  · Nipple abnormalities, dimpling, crusting, or ulcerations.  · Abnormal discharge from the nipple, especially blood.  · Redness, swelling, and pain of the breast.  · Calcium deposits (calcifications) or abnormalities seen on a mammogram, ultrasound results, or MRI results.  · Suspicious changes in the breast seen on your mammogram.    If the breast abnormality is found to be cancerous (malignant), a breast biopsy can help to determine what the best treatment is for you. There are many different types of breast biopsies. Talk with your health care provider about your options and which type is best for you.  Tell a health care provider about:  · Any allergies you have.  · All medicines you are taking, including vitamins, herbs, eye drops, creams, and over-the-counter medicines.  · Any problems you or family members have had with anesthetic medicines.  · Any blood disorders you have.  · Any surgeries you have had.  · Any medical conditions you have.  · Whether you are pregnant or may be pregnant.  What are the risks?  Generally, this is a safe procedure. However, problems may occur, including:  · Bleeding.  · Infection.  · Discomfort. This is temporary.  · Allergic reactions to medicines.  · Bruising and swelling of the breast.  · Alteration in the shape of the breast.  · Damage to other tissues.  · Not finding the lump or abnormality.  · Needing more surgery.    What happens before the procedure?  · Plan to have someone take you home after the procedure.  · Do not use any tobacco products, such as cigarettes, chewing tobacco, and e-cigarettes. If you need help quitting,  ask your health care provider.  · Do not drink alcohol for 24 hours before the procedure.  · Ask your health care provider about:  ? Changing or stopping your regular medicines. This is especially important if you are taking diabetes medicines or blood thinners.  ? Taking medicines such as aspirin and ibuprofen. These medicines can thin your blood. Do not take these medicines before your procedure if your health care provider instructs you not to.  · Wear a good support bra to the procedure.  · Ask your health care provider how your surgical site will be marked or identified.  · You may be given antibiotic medicine to help prevent infection.  · Your health care provider may perform a procedure to place a wire (needle localization) or a seed that gives off radiation (radioactive seed localization) in the breast lump. A mammogram, ultrasound, MRI, or a combination of these techniques will be done during this procedure to identify the location of the breast abnormality. The imaging technique used will depend on the type of biopsy you are having. The wire or seed will help the health care provider locate the lump when performing the biopsy, especially if the lump cannot be felt.  What happens during the procedure?  You may be given one or both of the following:  · A medicine to numb the breast area (local anesthetic).  · A medicine to help you relax (sedative) during the procedure.      The following are the different types of biopsies that can be performed.  Fine-Needle Aspiration  A thin needle will be attached to a syringe and inserted into a breast cyst. Fluid and cells will be removed. This technique is not as common as a core needle biopsy.  Core Needle Biopsy  A wide, hollow needle (core needle) will be inserted into a breast lump multiple times to remove tissue samples or cores.  Stereotactic Biopsy  You will lie face-down on a table. Your breast will pass through an opening in the table and will be gently  compressed into a fixed position. X-ray equipment and a computer will be used to locate the breast lump. The surgeon will use this information to collect several samples of tissue using a needle collection device.  Vacuum-Assisted Biopsy  A small incision (less than ¼ inch) will be made in your breast. A biopsy device that includes a hollow needle and vacuum will be passed through the incision and into the breast tissue. The vacuum will gently draw abnormal breast tissue into the needle to remove it. No stitches (sutures) will be needed. The incision will be covered with a bandage (dressing). In this type of biopsy, a larger tissue sample is removed than in a regular core needle biopsy.  Ultrasound-Guided Core Needle Biopsy  A high-frequency ultrasound will be used to help guide the core needle to the area of the mass or abnormality. An incision will be made to insert the needle. Then tissue samples will be removed.  Surgical Biopsy  This method requires an incision in the breast to remove part or all of the suspicious tissue. After the tissue is removed, the skin over the area will be closed with sutures and covered with a dressing. There are two types of surgical biopsies:  · Incisional biopsy. The surgeon will remove part of the breast lump.  · Excisional biopsy. The surgeon will attempt to remove the whole breast lump or as much of it as possible.    After any of these procedures, the tissue or liquid that was removed will be examined under a microscope.  What happens after the procedure?  · You will be taken to the recovery area. If you are doing well and have no problems, you will be allowed to go home.  · You may notice bruising on your breast. This is normal.  · You may have a pressure dressing applied on your breast for 24-48 hours. A pressure dressing is a bandage that is wrapped tightly around the chest to stop fluid from collecting underneath tissues. You may also be advised to wear a supportive bra  during this time.  · Do not drive for 24 hours if you received a sedative.  This information is not intended to replace advice given to you by your health care provider. Make sure you discuss any questions you have with your health care provider.  Document Released: 04/03/2005 Document Revised: 08/12/2015 Document Reviewed: 01/05/2015  Elsevier Interactive Patient Education © 2018 [employer]

## 2017-08-02 NOTE — H&P (Signed)
[name]; [id number]; July 06, 1975   HPI Patient is a 42 year old white female referred to my care by Dr. Nadara Mustard and the breast center for a papilloma in the left breast.  In addition, she has a fibroadenoma in the left breast.  Her mother was diagnosed with breast cancer in her 40s.  Patient has had multiple biopsies in the past which have been negative.  She recently had a core biopsy of the retroareolar area due to bloody nipple discharge and was found to have a probable papilloma.  A biopsy of the lump in her left breast revealed a fibroadenoma.  She currently has 0 out of 10 breast pain.  She states the bloody nipple discharge is present when she expresses it.  It does not spontaneously occur. Past Medical History:  Diagnosis Date  . ADD (attention deficit disorder)   . Breast disorder    fibroadenoma    Past Surgical History:  Procedure Laterality Date  . ABDOMINAL HYSTERECTOMY    . [place]  . CYSTO    . HERNIA REPAIR     x3  . VAGINAL HYSTERECTOMY     total  . WRIST GANGLION EXCISION Right     Family History  Problem Relation Age of Onset  . Cancer Mother        breast;age 32  . Diabetes Mother   . Alzheimer's disease Mother   . Hypertension Father   . CAD Other   . Cancer Maternal Grandmother     Current Outpatient Medications on File Prior to Visit  Medication Sig Dispense Refill  . amphetamine-dextroamphetamine (ADDERALL XR) 20 MG 24 hr capsule Take 40 mg by mouth daily.    . traMADol (ULTRAM) 50 MG tablet Take 50 mg by mouth 2 (two) times daily as needed.   0   No current facility-administered medications on file prior to visit.     Allergies  Allergen Reactions  . Amoxicillin   . Dilaudid [Hydromorphone Hcl] Itching  . Tylox [Oxycodone-Acetaminophen] Itching    Denies alllergy    Social History   Substance and Sexual Activity  Alcohol Use Yes   Comment: occ    Social History   Tobacco Use  Smoking Status Current Every Day  Smoker  . Packs/day: 1.50  . Years: 25.00  . Pack years: 37.50  . Types: Cigarettes  Smokeless Tobacco Never Used    Review of Systems  Constitutional: Negative.   HENT: Negative.   Eyes: Negative.   Respiratory: Negative.   Cardiovascular: Negative.   Gastrointestinal: Negative.   Genitourinary: Negative.   Musculoskeletal: Positive for neck pain.  Skin: Negative.   Neurological: Negative.   Endo/Heme/Allergies: Negative.   Psychiatric/Behavioral: Negative.     Objective   Vitals:   08/02/17 1001  BP: (!) 156/84  Pulse: 95  Temp: (!) 96.4 F (35.8 C)    Physical Exam  Constitutional: She is oriented to person, place, and time. She appears well-developed and well-nourished.  Cardiovascular: Normal rate, regular rhythm, normal heart sounds and intact distal pulses.  Pulmonary/Chest: Effort normal and breath sounds normal. No stridor. No respiratory distress. She has no wheezes. She has no rales.  Lymphadenopathy:    She has no cervical adenopathy.  Neurological: She is alert and oriented to person, place, and time.  Skin: Skin is warm and dry.  Vitals reviewed. Breast: Left breast with 1.5 cm dominant, mobile, well-circumscribed mass at the 9 o'clock position in the left breast.  I was able  to express bloody discharge from ductal.  No retroareolar mass noted.  No nipple dimpling noted.  The axilla was negative for palpable nodes.  Right breast reveals no dominant mass, nipple discharge, dimpling.  The axilla is negative for palpable nodes.  Mammogram, core biopsy, and pathology results all reviewed  Assessment  Papilloma, left breast Left breast mass Family history of breast cancer Plan   Patient is scheduled for left breast biopsy x2 on 08/21/2017.  The risks and benefits of the procedure including bleeding, infection, and the possibility of malignancy were fully explained to the patient, who gave informed consent.

## 2017-08-02 NOTE — Progress Notes (Signed)
[name]; [id number]; 10/27/1975   HPI Patient is a 42 year old white female referred to my care by Dr. Nadara Mustard and the breast center for a papilloma in the left breast.  In addition, she has a fibroadenoma in the left breast.  Her mother was diagnosed with breast cancer in her 74s.  Patient has had multiple biopsies in the past which have been negative.  She recently had a core biopsy of the retroareolar area due to bloody nipple discharge and was found to have a probable papilloma.  A biopsy of the lump in her left breast revealed a fibroadenoma.  She currently has 0 out of 10 breast pain.  She states the bloody nipple discharge is present when she expresses it.  It does not spontaneously occur. Past Medical History:  Diagnosis Date  . ADD (attention deficit disorder)   . Breast disorder    fibroadenoma    Past Surgical History:  Procedure Laterality Date  . ABDOMINAL HYSTERECTOMY    . [place]  . CYSTO    . HERNIA REPAIR     x3  . VAGINAL HYSTERECTOMY     total  . WRIST GANGLION EXCISION Right     Family History  Problem Relation Age of Onset  . Cancer Mother        breast;age 78  . Diabetes Mother   . Alzheimer's disease Mother   . Hypertension Father   . CAD Other   . Cancer Maternal Grandmother     Current Outpatient Medications on File Prior to Visit  Medication Sig Dispense Refill  . amphetamine-dextroamphetamine (ADDERALL XR) 20 MG 24 hr capsule Take 40 mg by mouth daily.    . traMADol (ULTRAM) 50 MG tablet Take 50 mg by mouth 2 (two) times daily as needed.   0   No current facility-administered medications on file prior to visit.     Allergies  Allergen Reactions  . Amoxicillin   . Dilaudid [Hydromorphone Hcl] Itching  . Tylox [Oxycodone-Acetaminophen] Itching    Denies alllergy    Social History   Substance and Sexual Activity  Alcohol Use Yes   Comment: occ    Social History   Tobacco Use  Smoking Status Current Every Day  Smoker  . Packs/day: 1.50  . Years: 25.00  . Pack years: 37.50  . Types: Cigarettes  Smokeless Tobacco Never Used    Review of Systems  Constitutional: Negative.   HENT: Negative.   Eyes: Negative.   Respiratory: Negative.   Cardiovascular: Negative.   Gastrointestinal: Negative.   Genitourinary: Negative.   Musculoskeletal: Positive for neck pain.  Skin: Negative.   Neurological: Negative.   Endo/Heme/Allergies: Negative.   Psychiatric/Behavioral: Negative.     Objective   Vitals:   08/02/17 1001  BP: (!) 156/84  Pulse: 95  Temp: (!) 96.4 F (35.8 C)    Physical Exam  Constitutional: She is oriented to person, place, and time. She appears well-developed and well-nourished.  Cardiovascular: Normal rate, regular rhythm, normal heart sounds and intact distal pulses.  Pulmonary/Chest: Effort normal and breath sounds normal. No stridor. No respiratory distress. She has no wheezes. She has no rales.  Lymphadenopathy:    She has no cervical adenopathy.  Neurological: She is alert and oriented to person, place, and time.  Skin: Skin is warm and dry.  Vitals reviewed. Breast: Left breast with 1.5 cm dominant, mobile, well-circumscribed mass at the 9 o'clock position in the left breast.  I was able  to express bloody discharge from ductal.  No retroareolar mass noted.  No nipple dimpling noted.  The axilla was negative for palpable nodes.  Right breast reveals no dominant mass, nipple discharge, dimpling.  The axilla is negative for palpable nodes.  Mammogram, core biopsy, and pathology results all reviewed  Assessment  Papilloma, left breast Left breast mass Family history of breast cancer Plan   Patient is scheduled for left breast biopsy x2 on 08/21/2017.  The risks and benefits of the procedure including bleeding, infection, and the possibility of malignancy were fully explained to the patient, who gave informed consent.

## 2017-08-06 ENCOUNTER — Encounter: Payer: Self-pay | Admitting: Adult Health

## 2017-08-06 ENCOUNTER — Ambulatory Visit (INDEPENDENT_AMBULATORY_CARE_PROVIDER_SITE_OTHER): Payer: 59 | Admitting: Adult Health

## 2017-08-06 VITALS — BP 142/84 | HR 76 | Ht 65.0 in | Wt 228.0 lb

## 2017-08-06 DIAGNOSIS — Z1211 Encounter for screening for malignant neoplasm of colon: Secondary | ICD-10-CM | POA: Insufficient documentation

## 2017-08-06 DIAGNOSIS — N6022 Fibroadenosis of left breast: Secondary | ICD-10-CM | POA: Diagnosis not present

## 2017-08-06 DIAGNOSIS — F1120 Opioid dependence, uncomplicated: Secondary | ICD-10-CM | POA: Diagnosis not present

## 2017-08-06 DIAGNOSIS — F1121 Opioid dependence, in remission: Secondary | ICD-10-CM | POA: Diagnosis not present

## 2017-08-06 DIAGNOSIS — Z01411 Encounter for gynecological examination (general) (routine) with abnormal findings: Secondary | ICD-10-CM | POA: Diagnosis not present

## 2017-08-06 DIAGNOSIS — D242 Benign neoplasm of left breast: Secondary | ICD-10-CM | POA: Diagnosis not present

## 2017-08-06 DIAGNOSIS — Z1212 Encounter for screening for malignant neoplasm of rectum: Secondary | ICD-10-CM

## 2017-08-06 DIAGNOSIS — F909 Attention-deficit hyperactivity disorder, unspecified type: Secondary | ICD-10-CM | POA: Diagnosis not present

## 2017-08-06 DIAGNOSIS — N6320 Unspecified lump in the left breast, unspecified quadrant: Secondary | ICD-10-CM

## 2017-08-06 DIAGNOSIS — Z01419 Encounter for gynecological examination (general) (routine) without abnormal findings: Secondary | ICD-10-CM | POA: Insufficient documentation

## 2017-08-06 DIAGNOSIS — F17200 Nicotine dependence, unspecified, uncomplicated: Secondary | ICD-10-CM | POA: Diagnosis not present

## 2017-08-06 LAB — HEMOCCULT GUIAC POC 1CARD (OFFICE): FECAL OCCULT BLD: NEGATIVE

## 2017-08-06 NOTE — Progress Notes (Signed)
History of Present Illness:  [name] is a 42 year old white female, married in for a well woman gyn exam, she is sp hysterectomy.She is having breast surgery May 7. PCP is Dr Nevada Crane.   Current Medications, Allergies, Past Medical History, Past Surgical History, Family History and Social History were reviewed in [employer] record.     Review of Systems: Patient denies any headaches, hearing loss, fatigue, blurred vision, shortness of breath, chest pain, abdominal pain, problems with bowel movements, urination, or intercourse. No joint pain or mood swings.    Physical Exam:BP (!) 142/84 (BP Location: Left Arm, Patient Position: Sitting, Cuff Size: Large)   Pulse 76   Ht 5' 5"  (1.651 m)   Wt 228 lb (103.4 kg)   BMI 37.94 kg/m  General:  Well developed, well nourished, no acute distress Skin:  Warm and dry Neck:  Midline trachea, normal thyroid, good ROM, no lymphadenopathy Lungs; Clear to auscultation bilaterally Breast:  No dominant palpable mass, retraction, or nipple discharge,has mass left breast at about 2 o'clock, will have removed in May Cardiovascular: Regular rate and rhythm Abdomen:  Soft, non tender, no hepatosplenomegaly Pelvic:  External genitalia is normal in appearance, no lesions.  The vagina is normal in appearance. Urethra has no lesions or masses. The cervix and uterus are absent.  No adnexal masses or tenderness noted.Bladder is non tender, no masses felt. Rectal: Good sphincter tone, no polyps, or hemorrhoids felt.  Hemoccult negative. Extremities/musculoskeletal:  No swelling or varicosities noted, no clubbing or cyanosis Psych:  No mood changes, alert and cooperative,seems happy PHQ 2 score 0.  Impression: 1. Well woman exam with routine gynecological exam   2. Papilloma of left breast   3. Screening for colorectal cancer   4. Left breast mass       Plan: Labs with PCP  Breast surgery scheduled May 7, to remove papilloma and  fibroadenoma  , after path back, will get BRCA gene test, as mom had breast cancer in her 65's and her grandma had it too Mammogram yearly Physical in 1 year

## 2017-08-07 MED FILL — ADDERALL XR 30 MG CAP SA: 30 | 30 days supply | Qty: 60 | Fill #0

## 2017-08-14 NOTE — Patient Instructions (Signed)
[name]  08/14/2017     @PREFPERIOPPHARMACY @   Your procedure is scheduled on  08/21/2017   Report to [facility] at  615  A.M.  Call this number if you have problems the morning of surgery:  [phone]   Remember:  Do not eat food or drink liquids after midnight.  Take these medicines the morning of surgery with A SIP OF WATER  adderall.   Do not wear jewelry, make-up or nail polish.  Do not wear lotions, powders, or perfumes, or deodorant.  Do not shave 48 hours prior to surgery.  Men may shave face and neck.  Do not bring valuables to the hospital.  [facility] is not responsible for any belongings or valuables.  Contacts, dentures or bridgework may not be worn into surgery.  Leave your suitcase in the car.  After surgery it may be brought to your room.  For patients admitted to the hospital, discharge time will be determined by your treatment team.  Patients discharged the day of surgery will not be allowed to drive home.   Name and phone number of your driver:   family Special instructions:  None  Please read over the following fact sheets that you were given. Anesthesia Post-op Instructions and Care and Recovery After Surgery       Breast Biopsy A breast biopsy is a test during which a sample of tissue is taken from your breast. The breast tissue is looked at under a microscope for cancer cells. What happens before the procedure?  Plan to have someone take you home after the test.  Do not use tobacco products. These include cigarettes, chewing tobacco, or e-cigarettes. If you need help quitting, ask your doctor.  Do not drink alcohol for 24 hours before the test.  Ask your doctor about: ? Changing or stopping your normal medicines. This is important if you take diabetes medicines or blood thinners. ? Taking medicines such as aspirin and ibuprofen. These medicines can thin your blood. Do not take these medicines before your procedure if  your doctor tells you not to.  Wear a good support bra to the test.  Ask your doctor how your surgical site will be marked or identified.  You may be given antibiotic medicine to help prevent infection.  You may be checked for extra fluid in your body (lymphedema).  Your doctor may place a wire or a seed in the lump. The wire or seed gives off radiation. This will help your doctor to see the lump during the biopsy. What happens during the procedure? You may be given the following:  A medicine to numb the breast area (local anesthetic).  A medicine to help you relax (sedative).  There are different types of breast biopsies. Each type is described below. Fine-Needle Aspiration  A needle will be put into the breast lump.  The needle will take out fluid and cells from the lump. Core-Needle Biopsy  A needle will be put into the breast lump.  The needle will be put into your breast several times.  The needle will remove breast tissue. Stereotactic Biopsy  You will lie on a table on your belly. Your breast will pass through a hole in the table. Your breast will be held in place.  X-rays and a computer will be used to locate the breast lump.  A needle will be used to remove tissue samples from your breast. Vacuum-Assisted Biopsy  A small cut (incision) will be made in your breast.  A biopsy device will be put through the cut and into the breast tissue.  The biopsy device will draw abnormal breast tissue into the biopsy device.  A large tissue sample will often be removed.  No stitches will be needed. Ultrasound-Guided Core-Needle Biopsy  Ultrasound imaging will help guide the needle into the area of the breast that is not normal.  A cut will be made in the breast. The needle will be put into the breast lump.  Tissue samples will be taken out. Surgical Biopsy  A cut will be made in the breast to remove tissue.  The cut will be closed with stitches and covered with a  bandage.  There are two types: ? Incisional biopsy. Your doctor will remove part of the breast lump. ? Excisional biopsy. Your doctor will try to remove the whole breast lump or as much as possible. All tissue or fluid samples will be looked at under a microscope. What happens after the procedure?  You will be able to go home when you are doing well and you are not having problems.  You may have bruising on your breast. This is normal.  A pressure bandage (dressing) may be put on your breast. It may be left on for 24-48 hours. This type of bandage is wrapped tightly around your chest. It helps to stop fluid from building up under tissues. You may also need to wear a supportive bra during this time.  Do not drive for 24 hours if you received a sedative. This information is not intended to replace advice given to you by your health care provider. Make sure you discuss any questions you have with your health care provider. Document Released: 06/26/2011 Document Revised: 12/09/2015 Document Reviewed: 01/05/2015 Elsevier Interactive Patient Education  2018 [employer].  Breast Biopsy, Care After These instructions give you information about caring for yourself after your procedure. Your doctor may also give you more specific instructions. Call your doctor if you have any problems or questions after your procedure. Follow these instructions at home: Medicines  Take over-the-counter and prescription medicines only as told by your doctor.  Do not drive for 24 hours if you received a sedative.  Do not drink alcohol while taking pain medicine.  Do not drive or use heavy machinery while taking prescription pain medicine. Biopsy Site Care   Follow instructions from your doctor about how to take care of your cut from surgery (incision) or puncture area. Make sure you: ? Wash your hands with soap and water before you change your bandage. If you cannot use soap and water, use hand  sanitizer. ? Change any bandages (dressings) as told by your doctor. ? Leave any stitches (sutures), skin glue, or skin tape (adhesive) strips in place. They may need to stay in place for 2 weeks or longer. If tape strips get loose and curl up, you may trim the loose edges. Do not remove tape strips completely unless your doctor says it is okay.  If you have stitches, keep them dry when you take a bath or a shower.  Check your cut or puncture area every day for signs of infection. Check for: ? More redness, swelling, or pain. ? More fluid or blood. ? Warmth. ? Pus or a bad smell.  Protect the biopsy area. Do not let the area get bumped. Activity  Avoid activities that could pull the biopsy site open. ? Avoid stretching. ?  Avoid reaching. ? Avoid exercise. ? Avoid sports. ? Avoid lifting anything that is heavier than 3 pounds (1.4 kg).  Return to your normal activities as told by your doctor. Ask your doctor what activities are safe for you. General instructions  Continue your normal diet.  Wear a good support bra for as long as told by your doctor.  Get checked for extra fluid in your body (lymphedema) as often as told by your doctor.  Keep all follow-up visits as told by your doctor. This is important. Contact a health care provider if:  You have more redness, swelling, or pain at the biopsy site.  You have more fluid or blood coming from your biopsy site.  Your biopsy site feels warm to the touch.  You have pus or a bad smell coming from the biopsy site.  Your biopsy site breaks open after the stitches, staples, or skin tape strips have been removed.  You have a rash.  You have a fever. Get help right away if:  You have more bleeding (more than a small spot) from the biopsy site.  You have trouble breathing.  You have red streaks around the biopsy site. This information is not intended to replace advice given to you by your health care provider. Make sure you  discuss any questions you have with your health care provider. Document Released: 01/28/2009 Document Revised: 12/09/2015 Document Reviewed: 01/05/2015 Elsevier Interactive Patient Education  2018 [place] Anesthesia is a term that refers to techniques, procedures, and medicines that help a person stay safe and comfortable during a medical procedure. Monitored anesthesia care, or sedation, is one type of anesthesia. Your anesthesia specialist may recommend sedation if you will be having a procedure that does not require you to be unconscious, such as:  Cataract surgery.  A dental procedure.  A biopsy.  A colonoscopy.  During the procedure, you may receive a medicine to help you relax (sedative). There are three levels of sedation:  Mild sedation. At this level, you may feel awake and relaxed. You will be able to follow directions.  Moderate sedation. At this level, you will be sleepy. You may not remember the procedure.  Deep sedation. At this level, you will be asleep. You will not remember the procedure.  The more medicine you are given, the deeper your level of sedation will be. Depending on how you respond to the procedure, the anesthesia specialist may change your level of sedation or the type of anesthesia to fit your needs. An anesthesia specialist will monitor you closely during the procedure. Let your health care provider know about:  Any allergies you have.  All medicines you are taking, including vitamins, herbs, eye drops, creams, and over-the-counter medicines.  Any use of steroids (by mouth or as a cream).  Any problems you or family members have had with sedatives and anesthetic medicines.  Any blood disorders you have.  Any surgeries you have had.  Any medical conditions you have, such as sleep apnea.  Whether you are pregnant or may be pregnant.  Any use of cigarettes, alcohol, or street drugs. What are the  risks? Generally, this is a safe procedure. However, problems may occur, including:  Getting too much medicine (oversedation).  Nausea.  Allergic reaction to medicines.  Trouble breathing. If this happens, a breathing tube may be used to help with breathing. It will be removed when you are awake and breathing on your own.  Heart trouble.  Lung  trouble.  Before the procedure Staying hydrated Follow instructions from your health care provider about hydration, which may include:  Up to 2 hours before the procedure - you may continue to drink clear liquids, such as water, clear fruit juice, black coffee, and plain tea.  Eating and drinking restrictions Follow instructions from your health care provider about eating and drinking, which may include:  8 hours before the procedure - stop eating heavy meals or foods such as meat, fried foods, or fatty foods.  6 hours before the procedure - stop eating light meals or foods, such as toast or cereal.  6 hours before the procedure - stop drinking milk or drinks that contain milk.  2 hours before the procedure - stop drinking clear liquids.  Medicines Ask your health care provider about:  Changing or stopping your regular medicines. This is especially important if you are taking diabetes medicines or blood thinners.  Taking medicines such as aspirin and ibuprofen. These medicines can thin your blood. Do not take these medicines before your procedure if your health care provider instructs you not to.  Tests and exams  You will have a physical exam.  You may have blood tests done to show: ? How well your kidneys and liver are working. ? How well your blood can clot.  General instructions  Plan to have someone take you home from the hospital or clinic.  If you will be going home right after the procedure, plan to have someone with you for 24 hours.  What happens during the procedure?  Your blood pressure, heart rate, breathing,  level of pain and overall condition will be monitored.  An IV tube will be inserted into one of your veins.  Your anesthesia specialist will give you medicines as needed to keep you comfortable during the procedure. This may mean changing the level of sedation.  The procedure will be performed. After the procedure  Your blood pressure, heart rate, breathing rate, and blood oxygen level will be monitored until the medicines you were given have worn off.  Do not drive for 24 hours if you received a sedative.  You may: ? Feel sleepy, clumsy, or nauseous. ? Feel forgetful about what happened after the procedure. ? Have a sore throat if you had a breathing tube during the procedure. ? Vomit. This information is not intended to replace advice given to you by your health care provider. Make sure you discuss any questions you have with your health care provider. Document Released: 12/28/2004 Document Revised: 09/10/2015 Document Reviewed: 07/25/2015 Elsevier Interactive Patient Education  2018 [place], Care After These instructions provide you with information about caring for yourself after your procedure. Your health care provider may also give you more specific instructions. Your treatment has been planned according to current medical practices, but problems sometimes occur. Call your health care provider if you have any problems or questions after your procedure. What can I expect after the procedure? After your procedure, it is common to:  Feel sleepy for several hours.  Feel clumsy and have poor balance for several hours.  Feel forgetful about what happened after the procedure.  Have poor judgment for several hours.  Feel nauseous or vomit.  Have a sore throat if you had a breathing tube during the procedure.  Follow these instructions at home: For at least 24 hours after the procedure:   Do not: ? Participate in activities in which you could  fall or become injured. ?  Drive. ? Use heavy machinery. ? Drink alcohol. ? Take sleeping pills or medicines that cause drowsiness. ? Make important decisions or sign legal documents. ? Take care of children on your own.  Rest. Eating and drinking  Follow the diet that is recommended by your health care provider.  If you vomit, drink water, juice, or soup when you can drink without vomiting.  Make sure you have little or no nausea before eating solid foods. General instructions  Have a responsible adult stay with you until you are awake and alert.  Take over-the-counter and prescription medicines only as told by your health care provider.  If you smoke, do not smoke without supervision.  Keep all follow-up visits as told by your health care provider. This is important. Contact a health care provider if:  You keep feeling nauseous or you keep vomiting.  You feel light-headed.  You develop a rash.  You have a fever. Get help right away if:  You have trouble breathing. This information is not intended to replace advice given to you by your health care provider. Make sure you discuss any questions you have with your health care provider. Document Released: 07/25/2015 Document Revised: 11/24/2015 Document Reviewed: 07/25/2015 Elsevier Interactive Patient Education  [employer].

## 2017-08-15 ENCOUNTER — Encounter (HOSPITAL_COMMUNITY)
Admission: RE | Admit: 2017-08-15 | Discharge: 2017-08-15 | Disposition: A | Discharge status: Home / Self Care | Payer: 59 | Source: Ambulatory Visit | Attending: General Surgery | Admitting: General Surgery

## 2017-08-15 ENCOUNTER — Encounter (HOSPITAL_COMMUNITY): Payer: Self-pay

## 2017-08-15 ENCOUNTER — Other Ambulatory Visit: Payer: Self-pay

## 2017-08-15 DIAGNOSIS — Z01812 Encounter for preprocedural laboratory examination: Secondary | ICD-10-CM | POA: Insufficient documentation

## 2017-08-15 HISTORY — DX: Nausea with vomiting, unspecified: R11.2

## 2017-08-15 HISTORY — DX: Other specified postprocedural states: Z98.890

## 2017-08-15 LAB — CBC WITH DIFFERENTIAL/PLATELET
BASOS ABS: 0 10*3/uL (ref 0.0–0.1)
Basophils Relative: 0 %
EOS ABS: 0.2 10*3/uL (ref 0.0–0.7)
EOS PCT: 2 %
HCT: 41 % (ref 36.0–46.0)
HEMOGLOBIN: 13.7 g/dL (ref 12.0–15.0)
LYMPHS ABS: 4 10*3/uL (ref 0.7–4.0)
LYMPHS PCT: 41 %
MCH: 30.2 pg (ref 26.0–34.0)
MCHC: 33.4 g/dL (ref 30.0–36.0)
MCV: 90.3 fL (ref 78.0–100.0)
Monocytes Absolute: 0.6 10*3/uL (ref 0.1–1.0)
Monocytes Relative: 6 %
NEUTROS PCT: 51 %
Neutro Abs: 5 10*3/uL (ref 1.7–7.7)
PLATELETS: 301 10*3/uL (ref 150–400)
RBC: 4.54 MIL/uL (ref 3.87–5.11)
RDW: 13.4 % (ref 11.5–15.5)
WBC: 9.7 10*3/uL (ref 4.0–10.5)

## 2017-08-15 LAB — BASIC METABOLIC PANEL
ANION GAP: 15 (ref 5–15)
BUN: 13 mg/dL (ref 6–20)
CHLORIDE: 101 mmol/L (ref 101–111)
CO2: 22 mmol/L (ref 22–32)
Calcium: 9.1 mg/dL (ref 8.9–10.3)
Creatinine, Ser: 0.56 mg/dL (ref 0.44–1.00)
GFR calc Af Amer: >60 mL/min (ref >60)
Glucose, Bld: 68 mg/dL (ref 65–99)
POTASSIUM: 2.7 mmol/L — AB (ref 3.5–5.1)
SODIUM: 138 mmol/L (ref 135–145)

## 2017-08-15 NOTE — Progress Notes (Signed)
  Dr. Arnoldo Morale notified of patient's potassium lab work 2.7.  Dr. Arnoldo Morale stated would order her some potassium supplement tomorrow.

## 2017-08-16 ENCOUNTER — Other Ambulatory Visit: Payer: Self-pay | Admitting: General Surgery

## 2017-08-16 MED ORDER — POTASSIUM CHLORIDE ER 10 MEQ PO TBCR: 20.0000 meq | EXTENDED_RELEASE_TABLET | Freq: Two times a day (BID) | ORAL | 0 refills | Status: DC

## 2017-08-16 NOTE — Pre-Procedure Instructions (Signed)
Dr Arnoldo Morale aware of 2.7 potassium and will call in Rx. Will do Istat on arrival. Dr Jimmye Norman also aware.

## 2017-08-21 ENCOUNTER — Ambulatory Visit (HOSPITAL_COMMUNITY): Payer: 59

## 2017-08-21 ENCOUNTER — Ambulatory Visit (HOSPITAL_COMMUNITY)
Admission: RE | Admit: 2017-08-21 | Discharge: 2017-08-21 | Disposition: A | Discharge status: Home / Self Care | Payer: 59 | Source: Ambulatory Visit | Attending: General Surgery | Admitting: General Surgery

## 2017-08-21 ENCOUNTER — Ambulatory Visit (HOSPITAL_COMMUNITY): Payer: 59 | Admitting: Anesthesiology

## 2017-08-21 ENCOUNTER — Encounter (HOSPITAL_COMMUNITY): Payer: Self-pay | Admitting: *Deleted

## 2017-08-21 ENCOUNTER — Encounter (HOSPITAL_COMMUNITY)
Admission: RE | Disposition: A | Discharge status: Home / Self Care | Payer: Self-pay | Source: Ambulatory Visit | Attending: General Surgery

## 2017-08-21 DIAGNOSIS — Z803 Family history of malignant neoplasm of breast: Secondary | ICD-10-CM | POA: Insufficient documentation

## 2017-08-21 DIAGNOSIS — F988 Other specified behavioral and emotional disorders with onset usually occurring in childhood and adolescence: Secondary | ICD-10-CM | POA: Diagnosis not present

## 2017-08-21 DIAGNOSIS — M1990 Unspecified osteoarthritis, unspecified site: Secondary | ICD-10-CM | POA: Insufficient documentation

## 2017-08-21 DIAGNOSIS — F17210 Nicotine dependence, cigarettes, uncomplicated: Secondary | ICD-10-CM | POA: Insufficient documentation

## 2017-08-21 DIAGNOSIS — Z885 Allergy status to narcotic agent status: Secondary | ICD-10-CM | POA: Diagnosis not present

## 2017-08-21 DIAGNOSIS — D249 Benign neoplasm of unspecified breast: Secondary | ICD-10-CM | POA: Diagnosis not present

## 2017-08-21 DIAGNOSIS — D242 Benign neoplasm of left breast: Secondary | ICD-10-CM | POA: Insufficient documentation

## 2017-08-21 DIAGNOSIS — N6323 Unspecified lump in the left breast, lower outer quadrant: Secondary | ICD-10-CM

## 2017-08-21 DIAGNOSIS — R928 Other abnormal and inconclusive findings on diagnostic imaging of breast: Secondary | ICD-10-CM

## 2017-08-21 DIAGNOSIS — N6321 Unspecified lump in the left breast, upper outer quadrant: Secondary | ICD-10-CM | POA: Diagnosis not present

## 2017-08-21 DIAGNOSIS — Z880 Allergy status to penicillin: Secondary | ICD-10-CM | POA: Insufficient documentation

## 2017-08-21 DIAGNOSIS — Z79899 Other long term (current) drug therapy: Secondary | ICD-10-CM | POA: Diagnosis not present

## 2017-08-21 DIAGNOSIS — N6320 Unspecified lump in the left breast, unspecified quadrant: Secondary | ICD-10-CM | POA: Diagnosis present

## 2017-08-21 HISTORY — PX: BREAST BIOPSY: SHX20

## 2017-08-21 LAB — BASIC METABOLIC PANEL
Anion gap: 11 (ref 5–15)
BUN: 13 mg/dL (ref 6–20)
CHLORIDE: 102 mmol/L (ref 101–111)
CO2: 22 mmol/L (ref 22–32)
CREATININE: 0.61 mg/dL (ref 0.44–1.00)
Calcium: 9.3 mg/dL (ref 8.9–10.3)
GFR calc non Af Amer: >60 mL/min (ref >60)
Glucose, Bld: 90 mg/dL (ref 65–99)
POTASSIUM: 4.3 mmol/L (ref 3.5–5.1)
SODIUM: 135 mmol/L (ref 135–145)

## 2017-08-21 LAB — POCT I-STAT 4, (NA,K, GLUC, HGB,HCT)
GLUCOSE: 81 mg/dL (ref 65–99)
HEMATOCRIT: 49 % — AB (ref 36.0–46.0)
HEMOGLOBIN: 16.7 g/dL — AB (ref 12.0–15.0)
Potassium: 6 mmol/L — ABNORMAL HIGH (ref 3.5–5.1)
SODIUM: 134 mmol/L — AB (ref 135–145)

## 2017-08-21 SURGERY — BREAST BIOPSY
Anesthesia: General | Site: Breast | Laterality: Left

## 2017-08-21 MED ORDER — KETOROLAC TROMETHAMINE 30 MG/ML IJ SOLN
30.0000 mg | Freq: Once | INTRAMUSCULAR | Status: AC
  Administered 2017-08-21: 30 mg via INTRAVENOUS

## 2017-08-21 MED ORDER — MIDAZOLAM HCL 5 MG/5ML IJ SOLN
INTRAMUSCULAR | Status: DC | PRN
  Administered 2017-08-21: 2 mg via INTRAVENOUS

## 2017-08-21 MED ORDER — SODIUM CHLORIDE 0.9 % IR SOLN
Status: DC | PRN
  Administered 2017-08-21: 1

## 2017-08-21 MED ORDER — FENTANYL CITRATE (PF) 250 MCG/5ML IJ SOLN
INTRAMUSCULAR | Status: AC
  Filled 2017-08-21: qty 5

## 2017-08-21 MED ORDER — BUPIVACAINE HCL (PF) 0.5 % IJ SOLN
INTRAMUSCULAR | Status: AC
  Filled 2017-08-21: qty 30

## 2017-08-21 MED ORDER — GLYCOPYRROLATE 0.2 MG/ML IJ SOLN
INTRAMUSCULAR | Status: DC | PRN
  Administered 2017-08-21: 0.2 mg via INTRAVENOUS

## 2017-08-21 MED ORDER — TRAMADOL HCL 50 MG PO TABS: 50.0000 mg | ORAL_TABLET | Freq: Four times a day (QID) | ORAL | 0 refills | Status: DC | PRN

## 2017-08-21 MED ORDER — CHLORHEXIDINE GLUCONATE CLOTH 2 % EX PADS: 6.0000 | MEDICATED_PAD | Freq: Once | CUTANEOUS | Status: DC

## 2017-08-21 MED ORDER — SUCCINYLCHOLINE CHLORIDE 20 MG/ML IJ SOLN
INTRAMUSCULAR | Status: AC
  Filled 2017-08-21: qty 1

## 2017-08-21 MED ORDER — FENTANYL CITRATE (PF) 100 MCG/2ML IJ SOLN
INTRAMUSCULAR | Status: DC | PRN
  Administered 2017-08-21: 100 ug via INTRAVENOUS

## 2017-08-21 MED ORDER — PHENYLEPHRINE 40 MCG/ML (10ML) SYRINGE FOR IV PUSH (FOR BLOOD PRESSURE SUPPORT)
PREFILLED_SYRINGE | INTRAVENOUS | Status: AC
  Filled 2017-08-21: qty 10

## 2017-08-21 MED ORDER — PROPOFOL 10 MG/ML IV BOLUS
INTRAVENOUS | Status: AC
  Filled 2017-08-21: qty 40

## 2017-08-21 MED ORDER — KETOROLAC TROMETHAMINE 30 MG/ML IJ SOLN
INTRAMUSCULAR | Status: AC
Start: 2017-08-21 — End: ?
  Filled 2017-08-21: qty 1

## 2017-08-21 MED ORDER — LACTATED RINGERS IV SOLN
INTRAVENOUS | Status: DC | PRN
  Administered 2017-08-21: 08:00:00 via INTRAVENOUS

## 2017-08-21 MED ORDER — ONDANSETRON HCL 4 MG/2ML IJ SOLN
INTRAMUSCULAR | Status: DC | PRN
  Administered 2017-08-21: 4 mg via INTRAVENOUS

## 2017-08-21 MED ORDER — DEXAMETHASONE SODIUM PHOSPHATE 10 MG/ML IJ SOLN
INTRAMUSCULAR | Status: DC | PRN
  Administered 2017-08-21: 4 mg via INTRAVENOUS

## 2017-08-21 MED ORDER — LIDOCAINE HCL (PF) 1 % IJ SOLN
INTRAMUSCULAR | Status: AC
  Filled 2017-08-21: qty 5

## 2017-08-21 MED ORDER — GLYCOPYRROLATE 0.2 MG/ML IJ SOLN
INTRAMUSCULAR | Status: AC
  Filled 2017-08-21: qty 1

## 2017-08-21 MED ORDER — SCOPOLAMINE 1 MG/3DAYS TD PT72
MEDICATED_PATCH | TRANSDERMAL | Status: AC
  Filled 2017-08-21: qty 1

## 2017-08-21 MED ORDER — SCOPOLAMINE 1 MG/3DAYS TD PT72
MEDICATED_PATCH | TRANSDERMAL | Status: DC | PRN
  Administered 2017-08-21: 1 via TRANSDERMAL

## 2017-08-21 MED ORDER — MIDAZOLAM HCL 2 MG/2ML IJ SOLN
INTRAMUSCULAR | Status: AC
  Filled 2017-08-21: qty 2

## 2017-08-21 MED ORDER — BUPIVACAINE HCL (PF) 0.5 % IJ SOLN
INTRAMUSCULAR | Status: DC | PRN
  Administered 2017-08-21: 14 mL

## 2017-08-21 SURGICAL SUPPLY — 27 items
ADH SKN CLS APL DERMABOND .7 (GAUZE/BANDAGES/DRESSINGS) ×1
BLADE SURG 15 STRL LF DISP TIS (BLADE) ×1 IMPLANT
BLADE SURG 15 STRL SS (BLADE) ×3
CHLORAPREP W/TINT 26ML (MISCELLANEOUS) ×3 IMPLANT
CLOTH BEACON ORANGE TIMEOUT ST (SAFETY) ×3 IMPLANT
COVER LIGHT HANDLE STERIS (MISCELLANEOUS) ×6 IMPLANT
DECANTER SPIKE VIAL GLASS SM (MISCELLANEOUS) ×3 IMPLANT
DERMABOND ADVANCED (GAUZE/BANDAGES/DRESSINGS) ×2
DERMABOND ADVANCED .7 DNX12 (GAUZE/BANDAGES/DRESSINGS) ×1 IMPLANT
ELECT REM PT RETURN 9FT ADLT (ELECTROSURGICAL) ×3
ELECTRODE REM PT RTRN 9FT ADLT (ELECTROSURGICAL) ×1 IMPLANT
GLOVE BIOGEL PI IND STRL 7.0 (GLOVE) ×2 IMPLANT
GLOVE BIOGEL PI INDICATOR 7.0 (GLOVE) ×4
GLOVE SURG SS PI 7.5 STRL IVOR (GLOVE) ×3 IMPLANT
GOWN STRL REUS W/TWL LRG LVL3 (GOWN DISPOSABLE) ×6 IMPLANT
KIT TURNOVER KIT A (KITS) ×3 IMPLANT
MANIFOLD NEPTUNE II (INSTRUMENTS) ×3 IMPLANT
NDL HYPO 25X1 1.5 SAFETY (NEEDLE) ×1 IMPLANT
NEEDLE HYPO 25X1 1.5 SAFETY (NEEDLE) ×3 IMPLANT
NS IRRIG 1000ML POUR BTL (IV SOLUTION) ×3 IMPLANT
PACK MINOR (CUSTOM PROCEDURE TRAY) ×3 IMPLANT
PAD ARMBOARD 7.5X6 YLW CONV (MISCELLANEOUS) ×3 IMPLANT
SET BASIN LINEN APH (SET/KITS/TRAYS/PACK) ×3 IMPLANT
SUT MNCRL AB 4-0 PS2 18 (SUTURE) ×5 IMPLANT
SUT VIC AB 3-0 SH 27 (SUTURE) ×3
SUT VIC AB 3-0 SH 27X BRD (SUTURE) IMPLANT
SYR CONTROL 10ML LL (SYRINGE) ×3 IMPLANT

## 2017-08-21 NOTE — Anesthesia Postprocedure Evaluation (Addendum)
Anesthesia Post Note Late Entry for 0935  Patient: [name]  Procedure(s) Performed: BREAST BIOPSY X 2 (Left Breast)  Patient location during evaluation: PACU Anesthesia Type: General Level of consciousness: awake and alert, oriented and patient cooperative Pain management: pain level controlled Vital Signs Assessment: post-procedure vital signs reviewed and stable Respiratory status: spontaneous breathing and respiratory function stable Cardiovascular status: stable Postop Assessment: no apparent nausea or vomiting Anesthetic complications: no     Last Vitals:  Vitals:   08/21/17 0940 08/21/17 0951  BP: 109/70 121/86  Pulse: 75 81  Resp: 14 16  Temp:  36.7 C  SpO2: 96% 94%    Last Pain:  Vitals:   08/21/17 0951  TempSrc: Oral  PainSc:                  ADAMS, AMY A

## 2017-08-21 NOTE — Op Note (Signed)
Patient:  [name]  DOB:  05-31-1975  MRN:  [id number]   Preop Diagnosis: Abnormal mammogram of left breast, left breast mass  Postop Diagnosis: Same  Procedure: Left breast biopsy x2  Surgeon: Aviva Signs, MD  Anes: General  Indications: Patient is a 42 year old white female who was found on mammography to have a left breast mass at the 2 o'clock position as well as an abnormality in the retroareolar region as well as a papilloma at the 6:00 region of the retroareolar region.  She now presents for left breast biopsy x2.  The risks and benefits of the procedures including bleeding, infection, and the possibility of malignancy were fully explained to the patient, who gave informed consent.  Procedure note: The patient was placed in supine position.  After general anesthesia was administered, the left breast was prepped and draped using the usual sterile technique with DuraPrep.  Surgical site confirmation was performed.  In reviewing the mammogram biopsy report, there was elected to proceed with a medial periareolar incision in order to obtain both the clip left at the 9 o'clock position of the periareolar region as well as to include the papilloma from the 6:00 region.  A ductal probe was used to isolate the involved ductal.  Tissue from the retroareolar region including the ductal of concern was carried out medially.  The specimen was sent to mammography for specimen radiography.  Definitely the clip from the 9:00 biopsy site was present.  In addition, it appeared that the ribbon clip was overlying this area.  The specimen was then sent to pathology for further examination treatment.  0.5% Sensorcaine was instilled into the surrounding area.  The skin was closed using a 4-0 Monocryl subcuticular suture.  Dermabond was applied.  In addition, a palpable ovoid mass was noted at the 2 o'clock position in the left breast.  This was excised without difficulty.  Was sent to pathology for  further examination.  A bleeding was controlled using Bovie electrocautery.  0.5% Sensorcaine was instilled into the surrounding wound.  The skin was closed using a 4-0 Monocryl subcuticular suture.  Dermabond was applied.  All tape and needle counts were correct at the end of the procedures.  The patient was awakened and transferred to PACU in stable condition.  Complications: None  EBL: Minimal  Specimen: Left breast biopsy x2

## 2017-08-21 NOTE — Discharge Instructions (Signed)
Breast Biopsy, Care After °Refer to this sheet in the next few weeks. These instructions provide you with information about caring for yourself after your procedure. Your health care provider may also give you more specific instructions. Your treatment has been planned according to current medical practices, but problems sometimes occur. Call your health care provider if you have any problems or questions after your procedure. °What can I expect after the procedure? °After your procedure, it is common to have: °· Bruising on your breast. °· Numbness, tingling, or pain near your biopsy site. ° °Follow these instructions at home: °Medicines °· Take over-the-counter and prescription medicines only as told by your health care provider. °· Do not drive for 24 hours if you received a sedative. °· Do not drink alcohol while taking pain medicine. °· Do not drive or operate heavy machinery while taking prescription pain medicine. °Biopsy Site Care ° °· Follow instructions from your health care provider about how to take care of your incision or puncture site. Make sure you: °? Wash your hands with soap and water before you change your dressing. If soap and water are not available, use hand sanitizer. °? Change any bandages (dressings) as told by your health care provider. °? Leave any stitches (sutures), skin glue, or adhesive strips in place. These skin closures may need to stay in place for 2 weeks or longer. If adhesive strip edges start to loosen and curl up, you may trim the loose edges. Do not remove adhesive strips completely unless your health care provider tells you to do that. °· If you have sutures, keep them dry when bathing. °· Check your incision or puncture area every day for signs of infection. Check for: °? More redness, swelling, or pain. °? More fluid or blood. °? Warmth. °? Pus or a bad smell. °· Protect the biopsy area. Do not let the area get bumped. °Activity °· If you had an incision during your  procedure, avoid activities that may pull the incision site open. Avoid stretching, reaching, exercise, sports, or lifting anything that is heavier than 3 lb (1.4 kg). °· Return to your normal activities as told by your health care provider. Ask your health care provider what activities are safe for you. °General instructions °· Resume your usual diet. °· Wear a good support bra for as long as told by your health care provider. °· Get checked for extra fluid around your lymph nodes (lymphedema) as often as told by your health care provider. °· Keep all follow-up visits as told by your health care provider. This is important. °Contact a health care provider if: °· You have more redness, swelling, or pain at the biopsy site. °· You have more fluid or blood coming from your biopsy site. °· Your biopsy site feels warm to the touch. °· You have pus or a bad smell coming from the biopsy site. °· Your biopsy site breaks open after the sutures, staples, or skin adhesive strips have been removed. °· You have a rash. °· You have a fever. °Get help right away if: °· You have increased bleeding (more than a small spot) from the biopsy site. °· You have difficulty breathing. °· You have red streaks around the biopsy site. °This information is not intended to replace advice given to you by your health care provider. Make sure you discuss any questions you have with your health care provider. °Document Released: 10/21/2004 Document Revised: 12/09/2015 Document Reviewed: 01/05/2015 °Elsevier Interactive Patient Education © 2018 [employer] Inc[employer] ° °

## 2017-08-21 NOTE — Anesthesia Preprocedure Evaluation (Signed)
Anesthesia Evaluation  Patient identified by MRN, date of birth, ID band Patient awake    Reviewed: Allergy & Precautions, NPO status , Patient's Chart, lab work & pertinent test results  History of Anesthesia Complications (+) PONV  Airway Mallampati: I  TM Distance: >3 FB Neck ROM: Full    Dental   Pulmonary neg pulmonary ROS, Current Smoker,  Smoker 1 ppd x 20 years    Pulmonary exam normal        Cardiovascular Exercise Tolerance: Good METS: 3 - Mets negative cardio ROS Normal cardiovascular examI  Denies recent CP/DOE   Neuro/Psych PSYCHIATRIC DISORDERS negative neurological ROS  negative psych ROS   GI/Hepatic negative GI ROS, Neg liver ROS,   Endo/Other  negative endocrine ROS  Renal/GU negative Renal ROS  negative genitourinary   Musculoskeletal negative musculoskeletal ROS (+) Arthritis , Osteoarthritis,    Abdominal   Peds negative pediatric ROS (+)  Hematology negative hematology ROS (+)   Anesthesia Other Findings   Reproductive/Obstetrics negative OB ROS                             Anesthesia Physical Anesthesia Plan  ASA: II  Anesthesia Plan: General   Post-op Pain Management:    Induction: Intravenous  PONV Risk Score and Plan:   Airway Management Planned: LMA  Additional Equipment:   Intra-op Plan:   Post-operative Plan:   Informed Consent: I have reviewed the patients History and Physical, chart, labs and discussed the procedure including the risks, benefits and alternatives for the proposed anesthesia with the patient or authorized representative who has indicated his/her understanding and acceptance.     Plan Discussed with:   Anesthesia Plan Comments:         Anesthesia Quick Evaluation

## 2017-08-21 NOTE — Transfer of Care (Signed)
Immediate Anesthesia Transfer of Care Note  Patient: [name]  Procedure(s) Performed: BREAST BIOPSY X 2 (Left )  Patient Location: PACU  Anesthesia Type:General  Level of Consciousness: awake  Airway & Oxygen Therapy: Patient Spontanous Breathing  Post-op Assessment: Report given to RN and Post -op Vital signs reviewed and stable  Post vital signs: Reviewed and stable  Last Vitals:  Vitals Value Taken Time  BP 107/53 08/21/2017  9:05 AM  Temp    Pulse 83 08/21/2017  9:08 AM  Resp 27 08/21/2017  9:08 AM  SpO2 92 % 08/21/2017  9:08 AM  Vitals shown include unvalidated device data.  Last Pain:  Vitals:   08/21/17 0626  TempSrc: Oral  PainSc:          Complications: No apparent anesthesia complications

## 2017-08-21 NOTE — Interval H&P Note (Signed)
History and Physical Interval Note:  08/21/2017 7:13 AM  [name]  has presented today for surgery, with the diagnosis of left breast mass, papilloma  The various methods of treatment have been discussed with the patient and family. After consideration of risks, benefits and other options for treatment, the patient has consented to  Procedure(s): BREAST BIOPSY X 2 (Left) as a surgical intervention .  The patient's history has been reviewed, patient examined, no change in status, stable for surgery.  I have reviewed the patient's chart and labs.  Questions were answered to the patient's satisfaction.     Aviva Signs

## 2017-08-22 ENCOUNTER — Encounter (HOSPITAL_COMMUNITY): Payer: Self-pay | Admitting: General Surgery

## 2017-08-27 ENCOUNTER — Other Ambulatory Visit: Payer: Self-pay | Admitting: General Surgery

## 2017-08-28 ENCOUNTER — Encounter: Payer: Self-pay | Admitting: General Surgery

## 2017-08-28 ENCOUNTER — Ambulatory Visit (INDEPENDENT_AMBULATORY_CARE_PROVIDER_SITE_OTHER): Payer: Self-pay | Admitting: General Surgery

## 2017-08-28 VITALS — BP 158/85 | HR 87 | Temp 98.0°F | Resp 20 | Wt 220.0 lb

## 2017-08-28 DIAGNOSIS — Z09 Encounter for follow-up examination after completed treatment for conditions other than malignant neoplasm: Secondary | ICD-10-CM

## 2017-08-28 NOTE — Progress Notes (Signed)
Subjective:     [name]  Status post left breast biopsy x2.  Patient doing well.  She has 3 out of 10 pain only when pressure is applied to her breast.  She denies any fever or chills. Objective:    BP (!) 158/85 (BP Location: Left Arm, Patient Position: Sitting, Cuff Size: Large)   Pulse 87   Temp 98 F (36.7 C) (Temporal)   Resp 20   Wt 220 lb (99.8 kg)   BMI 36.61 kg/m   General:  alert, cooperative and no distress  Left breast incisions healing well.  Ecchymosis around nipple incision resolving. Final pathology negative for malignancy x2.     Assessment:    Doing well postoperatively.    Plan:   Follow-up as needed.

## 2017-09-03 ENCOUNTER — Other Ambulatory Visit: Payer: Self-pay | Admitting: General Surgery

## 2017-09-04 MED FILL — ADDERALL XR 30 MG CAP SA: 30 | 30 days supply | Qty: 60 | Fill #0

## 2017-09-11 ENCOUNTER — Other Ambulatory Visit: Payer: Self-pay | Admitting: General Surgery

## 2017-09-11 ENCOUNTER — Telehealth: Payer: 59 | Admitting: Family

## 2017-09-11 DIAGNOSIS — A609 Anogenital herpesviral infection, unspecified: Secondary | ICD-10-CM

## 2017-09-11 MED ORDER — VALACYCLOVIR HCL 1 G PO TABS: 2000.0000 mg | ORAL_TABLET | Freq: Two times a day (BID) | ORAL | 0 refills | Status: DC

## 2017-09-11 MED FILL — valACYclovir HCL 1 GM TABS: 1 | 2 days supply | Qty: 8 | Fill #0

## 2017-09-11 NOTE — Progress Notes (Signed)
Thank you for the details you included in the comment boxes. Those details are very helpful in determining the best course of treatment for you and help us to provide the best care.  We are sorry that you are not feeling well.  Here is how we plan to help!  Based on what you have shared with me it does look like you have a viral infection.    Most cold sores or fever blisters are small fluid filled blisters around the mouth caused by herpes simplex virus.  The most common strain of the virus causing cold sores is herpes simplex virus 1.  It can be spread by skin contact, sharing eating utensils, or even sharing towels.  Cold sores are contagious to other people until dry. (Approximately 5-7 days).  Wash your hands. You can spread the virus to your eyes through handling your contact lenses after touching the lesions.  Most people experience pain at the sight or tingling sensations in their lips that may begin before the ulcers erupt.  Herpes simplex is treatable but not curable.  It may lie dormant for a long time and then reappear due to stress or prolonged sun exposure.  Many patients have success in treating their cold sores with an over the counter topical called Abreva.  You may apply the cream up to 5 times daily (maximum 10 days) until healing occurs.  If you would like to use an oral antiviral medication to speed the healing of your cold sore, I have sent a prescription to your local pharmacy Valacyclovir 2 gm twice daily for 1 day    HOME CARE:   Wash your hands frequently.  Do not pick at or rub the sore.  Don't open the blisters.  Avoid kissing other people during this time.  Avoid sharing drinking glasses, eating utensils, or razors.  Do not handle contact lenses unless you have thoroughly washed your hands with soap and warm water!  Avoid oral sex during this time.  Herpes from sores on your mouth can spread to your partner's genital area.  Avoid contact with anyone who has  eczema or a weakened immune system.  Cold sores are often triggered by exposure to intense sunlight, use a lip balm containing a sunscreen (SPF 30 or higher).  GET HELP RIGHT AWAY IF:   Blisters look infected.  Blisters occur near or in the eye.  Symptoms last longer than 10 days.  Your symptoms become worse.  MAKE SURE YOU:   Understand these instructions.  Will watch your condition.  Will get help right away if you are not doing well or get worse.    Your e-visit answers were reviewed by a board certified advanced clinical practitioner to complete your personal care plan.  Depending upon the condition, your plan could have  Included both over the counter or prescription medications.    Please review your pharmacy choice.  Be sure that the pharmacy you have chosen is open so that you can pick up your prescription now.  If there is a problem you csn message your provider in MyChart to have the prescription routed to another pharmacy.    Your safety is important to us.  If you have drug allergies check our prescription carefully.  For the next 24 hours you can use MyChart to ask questions about today's visit, request a non-urgent call back, or ask for a work or school excuse from your e-visit provider.  You will get an email in the   the next two days asking about your experience.  I hope that your e-visit has been valuable and will speed your recovery.  

## 2017-09-19 ENCOUNTER — Other Ambulatory Visit: Payer: Self-pay | Admitting: General Surgery

## 2017-09-25 ENCOUNTER — Encounter: Payer: Self-pay | Admitting: General Surgery

## 2017-09-25 ENCOUNTER — Ambulatory Visit (INDEPENDENT_AMBULATORY_CARE_PROVIDER_SITE_OTHER): Payer: Self-pay | Admitting: General Surgery

## 2017-09-25 VITALS — BP 141/81 | HR 83 | Temp 98.2°F | Resp 18 | Wt 219.0 lb

## 2017-09-25 DIAGNOSIS — Z09 Encounter for follow-up examination after completed treatment for conditions other than malignant neoplasm: Secondary | ICD-10-CM

## 2017-09-25 MED ORDER — TRAMADOL HCL 50 MG PO TABS: 50.0000 mg | ORAL_TABLET | Freq: Four times a day (QID) | ORAL | 0 refills | Status: DC | PRN

## 2017-09-25 NOTE — Progress Notes (Signed)
Subjective:     [name]  Presents with serosanguinous drainage from left breast incision.  Occurred this past weekend.  No fevers, purulent drainage noted.  Has slowed up. Objective:    BP (!) 141/81 (BP Location: Left Arm, Patient Position: Sitting, Cuff Size: Large)   Pulse 83   Temp 98.2 F (36.8 C) (Temporal)   Resp 18   Wt 219 lb (99.3 kg)   BMI 36.44 kg/m   General:  alert, cooperative and no distress  Left breast periareolar incision with graunlation tissue superiorly with punctate opening.  No drainage, erythema noted.  Silver nitrate applied.     Assessment:    Superficial wound dehiscence, left breast, not infected    Plan:    Keep wound clean and dry.  Follow up here in two weeks.

## 2017-10-02 MED FILL — ADDERALL XR 30 MG CAP SA: 30 | 30 days supply | Qty: 60 | Fill #0

## 2017-10-11 ENCOUNTER — Ambulatory Visit: Payer: Self-pay | Admitting: General Surgery

## 2017-10-30 DIAGNOSIS — F900 Attention-deficit hyperactivity disorder, predominantly inattentive type: Secondary | ICD-10-CM | POA: Diagnosis not present

## 2017-10-30 DIAGNOSIS — F1120 Opioid dependence, uncomplicated: Secondary | ICD-10-CM | POA: Diagnosis not present

## 2017-10-30 DIAGNOSIS — F1121 Opioid dependence, in remission: Secondary | ICD-10-CM | POA: Diagnosis not present

## 2017-10-31 MED FILL — ADDERALL XR 30 MG CAP SA: 30 | 30 days supply | Qty: 60 | Fill #0

## 2017-11-28 MED FILL — ADDERALL XR 30 MG CAP SA: 30 | 30 days supply | Qty: 60 | Fill #0

## 2017-12-26 MED FILL — ADDERALL XR 30 MG CAP SA: 30 | 30 days supply | Qty: 60 | Fill #0

## 2018-01-21 DIAGNOSIS — F1120 Opioid dependence, uncomplicated: Secondary | ICD-10-CM | POA: Diagnosis not present

## 2018-01-23 MED FILL — ADDERALL XR 30 MG CAP SA: 30 | 30 days supply | Qty: 60 | Fill #0

## 2018-02-19 DIAGNOSIS — F1120 Opioid dependence, uncomplicated: Secondary | ICD-10-CM | POA: Diagnosis not present

## 2018-02-20 MED FILL — ADDERALL XR 30 MG CAP SA: 30 | 30 days supply | Qty: 60 | Fill #0

## 2018-03-20 MED FILL — ADDERALL XR 30 MG CAP SA: 30 | 30 days supply | Qty: 60 | Fill #0

## 2018-04-16 MED FILL — ADDERALL XR 30 MG CAP SA: 30 | 30 days supply | Qty: 60 | Fill #0

## 2018-05-14 MED FILL — ADDERALL XR 30 MG CAP SA: 30 | 30 days supply | Qty: 60 | Fill #0

## 2018-06-11 MED FILL — ADDERALL XR 30 MG CAP SA: 30 | 30 days supply | Qty: 60 | Fill #0

## 2018-07-22 DIAGNOSIS — F988 Other specified behavioral and emotional disorders with onset usually occurring in childhood and adolescence: Secondary | ICD-10-CM | POA: Diagnosis not present

## 2018-07-22 MED FILL — ADDERALL XR 30 MG CAP SA: 30 | 30 days supply | Qty: 60 | Fill #0

## 2018-08-20 MED FILL — ADDERALL XR 30 MG CAP SA: 30 | 30 days supply | Qty: 60 | Fill #0

## 2018-09-16 MED FILL — ADDERALL XR 30 MG CAP SA: 30 | 30 days supply | Qty: 60 | Fill #0

## 2018-10-08 DIAGNOSIS — F988 Other specified behavioral and emotional disorders with onset usually occurring in childhood and adolescence: Secondary | ICD-10-CM | POA: Diagnosis not present

## 2018-10-08 DIAGNOSIS — Z6837 Body mass index (BMI) 37.0-37.9, adult: Secondary | ICD-10-CM | POA: Diagnosis not present

## 2018-10-14 MED FILL — ADDERALL XR 30 MG CAP SA: 30 | 60 days supply | Qty: 60 | Fill #0

## 2018-11-11 MED FILL — ADDERALL XR 30 MG CAP SA: 30 | 30 days supply | Qty: 60 | Fill #0

## 2018-12-09 MED FILL — ADDERALL XR 30 MG CAP SA: 30 | 30 days supply | Qty: 60 | Fill #0

## 2019-01-02 DIAGNOSIS — F988 Other specified behavioral and emotional disorders with onset usually occurring in childhood and adolescence: Secondary | ICD-10-CM | POA: Diagnosis not present

## 2019-01-03 DIAGNOSIS — Z20828 Contact with and (suspected) exposure to other viral communicable diseases: Secondary | ICD-10-CM | POA: Diagnosis not present

## 2019-01-06 MED FILL — ADDERALL XR 30 MG CAP SA: 30 | 30 days supply | Qty: 60 | Fill #0

## 2019-02-03 MED FILL — ADDERALL XR 30 MG CAP SA: 30 | 30 days supply | Qty: 60 | Fill #0

## 2019-03-04 MED FILL — ADDERALL XR 30 MG CAP SA: 30 | 30 days supply | Qty: 60 | Fill #0

## 2019-03-05 DIAGNOSIS — Z20828 Contact with and (suspected) exposure to other viral communicable diseases: Secondary | ICD-10-CM | POA: Diagnosis not present

## 2019-03-27 DIAGNOSIS — F988 Other specified behavioral and emotional disorders with onset usually occurring in childhood and adolescence: Secondary | ICD-10-CM | POA: Diagnosis not present

## 2019-04-01 MED FILL — ADDERALL XR 30 MG CAP SA: 30 | 30 days supply | Qty: 60 | Fill #0

## 2019-04-29 MED FILL — ADDERALL XR 30 MG CAP SA: 30 | 30 days supply | Qty: 60 | Fill #0

## 2019-05-27 MED FILL — ADDERALL XR 30 MG CAP SA: 30 | 30 days supply | Qty: 60 | Fill #0

## 2019-06-18 DIAGNOSIS — F988 Other specified behavioral and emotional disorders with onset usually occurring in childhood and adolescence: Secondary | ICD-10-CM | POA: Diagnosis not present

## 2019-06-24 MED FILL — ADDERALL XR 30 MG CAP SA: 30 | 30 days supply | Qty: 60 | Fill #0

## 2019-07-22 MED FILL — ADDERALL XR 30 MG CAP SA: 30 | 30 days supply | Qty: 60 | Fill #0

## 2019-08-19 MED FILL — ADDERALL XR 30 MG CAP SA: 30 | 30 days supply | Qty: 60 | Fill #0

## 2019-09-10 DIAGNOSIS — F988 Other specified behavioral and emotional disorders with onset usually occurring in childhood and adolescence: Secondary | ICD-10-CM | POA: Diagnosis not present

## 2019-09-16 MED FILL — ADDERALL XR 30 MG CAP SA: 30 | 30 days supply | Qty: 60 | Fill #0

## 2019-10-14 MED FILL — ADDERALL XR 30 MG CAP SA: 30 | 30 days supply | Qty: 60 | Fill #0

## 2019-11-11 MED FILL — ADDERALL XR 30 MG CAP SA: 30 | 30 days supply | Qty: 60 | Fill #0

## 2019-12-09 DIAGNOSIS — F988 Other specified behavioral and emotional disorders with onset usually occurring in childhood and adolescence: Secondary | ICD-10-CM | POA: Diagnosis not present

## 2019-12-09 MED FILL — ADDERALL XR 30 MG CAP SA: 30 | 30 days supply | Qty: 60 | Fill #0

## 2020-01-08 MED FILL — ADDERALL XR 30 MG CAP SA: 30 | 30 days supply | Qty: 60 | Fill #0

## 2020-02-05 MED FILL — ADDERALL XR 30 MG CAP SA: 30 | 30 days supply | Qty: 60 | Fill #0

## 2020-03-01 ENCOUNTER — Other Ambulatory Visit (HOSPITAL_COMMUNITY): Payer: Self-pay | Admitting: Family Medicine

## 2020-03-01 DIAGNOSIS — F988 Other specified behavioral and emotional disorders with onset usually occurring in childhood and adolescence: Secondary | ICD-10-CM | POA: Diagnosis not present

## 2020-03-01 MED FILL — ADDERALL XR 30 MG CAP SA: 30 | 30 days supply | Qty: 60 | Fill #0

## 2020-03-31 MED FILL — ADDERALL XR 30 MG CAP SA: 30 | 30 days supply | Qty: 60 | Fill #0

## 2020-04-30 MED FILL — ADDERALL XR 30 MG CAP SA: 30 | 30 days supply | Qty: 60 | Fill #0

## 2020-05-27 ENCOUNTER — Other Ambulatory Visit (HOSPITAL_COMMUNITY): Payer: Self-pay | Admitting: Family Medicine

## 2020-05-27 DIAGNOSIS — F988 Other specified behavioral and emotional disorders with onset usually occurring in childhood and adolescence: Secondary | ICD-10-CM | POA: Diagnosis not present

## 2020-05-27 DIAGNOSIS — Z79899 Other long term (current) drug therapy: Secondary | ICD-10-CM | POA: Diagnosis not present

## 2020-05-28 MED FILL — ADDERALL XR 30 MG CAP SA: 30 | 30 days supply | Qty: 60 | Fill #0

## 2020-06-24 ENCOUNTER — Other Ambulatory Visit (HOSPITAL_COMMUNITY): Payer: Self-pay | Admitting: Family Medicine

## 2020-06-25 MED FILL — ADDERALL XR 30 MG CAP SA: 30 | 30 days supply | Qty: 60 | Fill #0

## 2020-07-22 ENCOUNTER — Other Ambulatory Visit (HOSPITAL_COMMUNITY): Payer: Self-pay

## 2020-07-23 ENCOUNTER — Other Ambulatory Visit (HOSPITAL_COMMUNITY): Payer: Self-pay

## 2020-07-23 MED FILL — Amphetamine-Dextroamphetamine Cap ER 24HR 30 MG: ORAL | 30 days supply | Qty: 60 | Fill #0 | Status: AC

## 2020-08-19 ENCOUNTER — Other Ambulatory Visit (HOSPITAL_COMMUNITY): Payer: Self-pay

## 2020-08-19 DIAGNOSIS — F988 Other specified behavioral and emotional disorders with onset usually occurring in childhood and adolescence: Secondary | ICD-10-CM | POA: Diagnosis not present

## 2020-08-19 MED ORDER — AMPHETAMINE-DEXTROAMPHET ER 30 MG PO CP24
ORAL_CAPSULE | ORAL | 0 refills | Status: DC
  Filled 2020-08-19 – 2020-08-20 (×2): qty 60, 30d supply, fill #0

## 2020-08-19 MED ORDER — AMPHETAMINE-DEXTROAMPHET ER 30 MG PO CP24
ORAL_CAPSULE | ORAL | 0 refills | Status: DC
  Filled 2020-09-18: qty 60, 30d supply, fill #0

## 2020-08-19 MED ORDER — AMPHETAMINE-DEXTROAMPHET ER 30 MG PO CP24
ORAL_CAPSULE | ORAL | 0 refills | Status: DC
  Filled 2020-10-15: qty 60, 30d supply, fill #0

## 2020-08-20 ENCOUNTER — Other Ambulatory Visit (HOSPITAL_COMMUNITY): Payer: Self-pay

## 2020-08-20 MED FILL — Amphetamine-Dextroamphetamine Cap ER 24HR 30 MG: ORAL | 30 days supply | Qty: 60 | Fill #0 | Status: AC

## 2020-09-16 ENCOUNTER — Other Ambulatory Visit (HOSPITAL_COMMUNITY): Payer: Self-pay

## 2020-09-17 ENCOUNTER — Other Ambulatory Visit (HOSPITAL_COMMUNITY): Payer: Self-pay

## 2020-09-18 ENCOUNTER — Other Ambulatory Visit (HOSPITAL_COMMUNITY): Payer: Self-pay

## 2020-10-15 ENCOUNTER — Other Ambulatory Visit (HOSPITAL_COMMUNITY): Payer: Self-pay

## 2020-11-09 ENCOUNTER — Other Ambulatory Visit (HOSPITAL_COMMUNITY): Payer: Self-pay

## 2020-11-09 MED ORDER — AMPHETAMINE-DEXTROAMPHET ER 30 MG PO CP24
ORAL_CAPSULE | ORAL | 0 refills | Status: DC
  Filled 2020-11-09 – 2020-11-12 (×2): qty 60, 30d supply, fill #0

## 2020-11-10 ENCOUNTER — Other Ambulatory Visit (HOSPITAL_COMMUNITY): Payer: Self-pay

## 2020-11-12 ENCOUNTER — Other Ambulatory Visit (HOSPITAL_COMMUNITY): Payer: Self-pay

## 2020-11-17 ENCOUNTER — Other Ambulatory Visit (HOSPITAL_COMMUNITY): Payer: Self-pay

## 2020-11-17 DIAGNOSIS — U071 COVID-19: Secondary | ICD-10-CM | POA: Diagnosis not present

## 2020-11-17 DIAGNOSIS — F17210 Nicotine dependence, cigarettes, uncomplicated: Secondary | ICD-10-CM | POA: Diagnosis not present

## 2020-11-17 DIAGNOSIS — F988 Other specified behavioral and emotional disorders with onset usually occurring in childhood and adolescence: Secondary | ICD-10-CM | POA: Diagnosis not present

## 2020-11-17 MED ORDER — AMPHETAMINE-DEXTROAMPHET ER 30 MG PO CP24
ORAL_CAPSULE | ORAL | 0 refills | Status: DC
  Filled 2020-12-10: qty 60, 30d supply, fill #0

## 2020-11-17 MED ORDER — PREDNISONE 20 MG PO TABS
ORAL_TABLET | ORAL | 0 refills | Status: DC
  Filled 2020-11-17: qty 10, 5d supply, fill #0

## 2020-11-17 MED ORDER — AMPHETAMINE-DEXTROAMPHET ER 30 MG PO CP24
ORAL_CAPSULE | ORAL | 0 refills | Status: DC
  Filled 2021-01-07: qty 60, 30d supply, fill #0

## 2020-11-17 MED ORDER — AMPHETAMINE-DEXTROAMPHET ER 30 MG PO CP24: ORAL_CAPSULE | ORAL | 0 refills | Status: DC

## 2020-11-23 ENCOUNTER — Other Ambulatory Visit (HOSPITAL_COMMUNITY): Payer: Self-pay

## 2020-11-23 MED ORDER — MOUNJARO 2.5 MG/0.5ML ~~LOC~~ SOAJ
SUBCUTANEOUS | 0 refills | Status: DC
  Filled 2020-11-23: qty 2, 28d supply, fill #0

## 2020-12-09 ENCOUNTER — Other Ambulatory Visit (HOSPITAL_COMMUNITY): Payer: Self-pay

## 2020-12-10 ENCOUNTER — Other Ambulatory Visit (HOSPITAL_COMMUNITY): Payer: Self-pay

## 2020-12-16 ENCOUNTER — Other Ambulatory Visit (HOSPITAL_COMMUNITY): Payer: Self-pay

## 2020-12-16 MED ORDER — MOUNJARO 5 MG/0.5ML ~~LOC~~ SOAJ
SUBCUTANEOUS | 0 refills | Status: DC
  Filled 2020-12-16: qty 2, 28d supply, fill #0

## 2020-12-21 ENCOUNTER — Other Ambulatory Visit (HOSPITAL_COMMUNITY): Payer: Self-pay

## 2021-01-06 ENCOUNTER — Other Ambulatory Visit (HOSPITAL_COMMUNITY): Payer: Self-pay

## 2021-01-07 ENCOUNTER — Other Ambulatory Visit (HOSPITAL_COMMUNITY): Payer: Self-pay

## 2021-01-10 ENCOUNTER — Other Ambulatory Visit (HOSPITAL_COMMUNITY): Payer: Self-pay

## 2021-01-10 MED ORDER — MOUNJARO 7.5 MG/0.5ML ~~LOC~~ SOAJ
SUBCUTANEOUS | 0 refills | Status: DC
  Filled 2021-01-10: qty 2, 28d supply, fill #0

## 2021-02-03 ENCOUNTER — Other Ambulatory Visit (HOSPITAL_COMMUNITY): Payer: Self-pay

## 2021-02-03 DIAGNOSIS — F988 Other specified behavioral and emotional disorders with onset usually occurring in childhood and adolescence: Secondary | ICD-10-CM | POA: Diagnosis not present

## 2021-02-03 MED ORDER — AMPHETAMINE-DEXTROAMPHET ER 30 MG PO CP24
ORAL_CAPSULE | ORAL | 0 refills | Status: DC
  Filled 2021-03-07 – 2021-03-09 (×2): qty 60, 30d supply, fill #0

## 2021-02-03 MED ORDER — AMPHETAMINE-DEXTROAMPHET ER 30 MG PO CP24
ORAL_CAPSULE | ORAL | 0 refills | Status: DC
  Filled 2021-02-03: qty 60, 30d supply, fill #0

## 2021-02-03 MED ORDER — AMPHETAMINE-DEXTROAMPHET ER 30 MG PO CP24
ORAL_CAPSULE | ORAL | 0 refills | Status: DC
  Filled 2021-04-06: qty 60, 30d supply, fill #0

## 2021-02-04 ENCOUNTER — Other Ambulatory Visit (HOSPITAL_COMMUNITY): Payer: Self-pay

## 2021-02-04 MED ORDER — MOUNJARO 10 MG/0.5ML ~~LOC~~ SOAJ
SUBCUTANEOUS | 0 refills | Status: DC
  Filled 2021-02-04: qty 2, 28d supply, fill #0

## 2021-02-07 ENCOUNTER — Other Ambulatory Visit (HOSPITAL_COMMUNITY): Payer: Self-pay

## 2021-03-04 ENCOUNTER — Other Ambulatory Visit (HOSPITAL_COMMUNITY): Payer: Self-pay

## 2021-03-04 MED ORDER — MOUNJARO 10 MG/0.5ML ~~LOC~~ SOAJ
SUBCUTANEOUS | 0 refills | Status: DC
  Filled 2021-03-04: qty 2, 28d supply, fill #0

## 2021-03-07 ENCOUNTER — Other Ambulatory Visit (HOSPITAL_COMMUNITY): Payer: Self-pay

## 2021-03-09 ENCOUNTER — Other Ambulatory Visit (HOSPITAL_COMMUNITY): Payer: Self-pay

## 2021-03-31 ENCOUNTER — Other Ambulatory Visit (HOSPITAL_COMMUNITY): Payer: Self-pay

## 2021-03-31 MED ORDER — MOUNJARO 10 MG/0.5ML ~~LOC~~ SOAJ
SUBCUTANEOUS | 0 refills | Status: DC
  Filled 2021-03-31: qty 2, 28d supply, fill #0

## 2021-04-06 ENCOUNTER — Other Ambulatory Visit (HOSPITAL_COMMUNITY): Payer: Self-pay

## 2021-04-08 ENCOUNTER — Other Ambulatory Visit (HOSPITAL_COMMUNITY): Payer: Self-pay

## 2021-04-22 ENCOUNTER — Other Ambulatory Visit (HOSPITAL_COMMUNITY): Payer: Self-pay

## 2021-04-22 MED ORDER — MOUNJARO 12.5 MG/0.5ML ~~LOC~~ SOAJ
SUBCUTANEOUS | 0 refills | Status: DC
  Filled 2021-04-22: qty 2, 28d supply, fill #0

## 2021-04-25 ENCOUNTER — Other Ambulatory Visit (HOSPITAL_COMMUNITY): Payer: Self-pay

## 2021-05-04 ENCOUNTER — Other Ambulatory Visit (HOSPITAL_COMMUNITY): Payer: Self-pay

## 2021-05-04 DIAGNOSIS — F988 Other specified behavioral and emotional disorders with onset usually occurring in childhood and adolescence: Secondary | ICD-10-CM | POA: Diagnosis not present

## 2021-05-04 MED ORDER — AMPHETAMINE-DEXTROAMPHET ER 30 MG PO CP24
ORAL_CAPSULE | ORAL | 0 refills | Status: DC
  Filled 2021-06-08: qty 60, 30d supply, fill #0

## 2021-05-04 MED ORDER — AMPHETAMINE-DEXTROAMPHET ER 30 MG PO CP24
ORAL_CAPSULE | ORAL | 0 refills | Status: DC
  Filled 2021-07-04: qty 60, 30d supply, fill #0

## 2021-05-04 MED ORDER — AMPHETAMINE-DEXTROAMPHET ER 30 MG PO CP24
ORAL_CAPSULE | ORAL | 0 refills | Status: DC
  Filled 2021-05-04 – 2021-05-05 (×4): qty 60, 30d supply, fill #0

## 2021-05-05 ENCOUNTER — Other Ambulatory Visit (HOSPITAL_COMMUNITY): Payer: Self-pay

## 2021-05-08 ENCOUNTER — Telehealth: Payer: 59 | Admitting: Family

## 2021-05-08 DIAGNOSIS — B009 Herpesviral infection, unspecified: Secondary | ICD-10-CM

## 2021-05-08 MED ORDER — VALACYCLOVIR HCL 1 G PO TABS
2000.0000 mg | ORAL_TABLET | Freq: Two times a day (BID) | ORAL | 0 refills | Status: AC
Start: 2021-05-08 — End: 2021-05-10
  Filled 2021-05-08: qty 4, 1d supply, fill #0

## 2021-05-08 NOTE — Progress Notes (Signed)

## 2021-05-09 ENCOUNTER — Other Ambulatory Visit (HOSPITAL_COMMUNITY): Payer: Self-pay

## 2021-05-12 ENCOUNTER — Other Ambulatory Visit (HOSPITAL_COMMUNITY): Payer: Self-pay

## 2021-05-12 MED ORDER — MOUNJARO 12.5 MG/0.5ML ~~LOC~~ SOAJ
SUBCUTANEOUS | 0 refills | Status: DC
  Filled 2021-05-12: qty 2, 28d supply, fill #0

## 2021-05-14 ENCOUNTER — Other Ambulatory Visit (HOSPITAL_COMMUNITY): Payer: Self-pay

## 2021-05-19 DIAGNOSIS — D229 Melanocytic nevi, unspecified: Secondary | ICD-10-CM | POA: Diagnosis not present

## 2021-05-19 DIAGNOSIS — F17210 Nicotine dependence, cigarettes, uncomplicated: Secondary | ICD-10-CM | POA: Diagnosis not present

## 2021-05-19 DIAGNOSIS — Z6828 Body mass index (BMI) 28.0-28.9, adult: Secondary | ICD-10-CM | POA: Diagnosis not present

## 2021-06-07 ENCOUNTER — Other Ambulatory Visit (HOSPITAL_COMMUNITY): Payer: Self-pay

## 2021-06-08 ENCOUNTER — Other Ambulatory Visit (HOSPITAL_COMMUNITY): Payer: Self-pay

## 2021-06-08 MED ORDER — MOUNJARO 12.5 MG/0.5ML ~~LOC~~ SOAJ
SUBCUTANEOUS | 0 refills | Status: DC
  Filled 2021-06-08: qty 2, 28d supply, fill #0

## 2021-07-04 ENCOUNTER — Other Ambulatory Visit (HOSPITAL_COMMUNITY): Payer: Self-pay

## 2021-07-04 MED ORDER — MOUNJARO 15 MG/0.5ML ~~LOC~~ SOAJ
SUBCUTANEOUS | 0 refills | Status: DC
  Filled 2021-10-04: qty 2, 28d supply, fill #0

## 2021-07-04 MED ORDER — MOUNJARO 12.5 MG/0.5ML ~~LOC~~ SOAJ
SUBCUTANEOUS | 0 refills | Status: DC
  Filled 2021-07-04: qty 2, 28d supply, fill #0

## 2021-07-06 ENCOUNTER — Other Ambulatory Visit (HOSPITAL_COMMUNITY): Payer: Self-pay

## 2021-07-07 ENCOUNTER — Other Ambulatory Visit (HOSPITAL_COMMUNITY): Payer: Self-pay

## 2021-07-08 ENCOUNTER — Other Ambulatory Visit (HOSPITAL_COMMUNITY): Payer: Self-pay

## 2021-07-27 ENCOUNTER — Other Ambulatory Visit (HOSPITAL_COMMUNITY): Payer: Self-pay

## 2021-07-27 MED ORDER — MOUNJARO 15 MG/0.5ML ~~LOC~~ SOAJ
SUBCUTANEOUS | 0 refills | Status: DC
  Filled 2021-07-27: qty 2, 28d supply, fill #0

## 2021-07-29 ENCOUNTER — Other Ambulatory Visit (HOSPITAL_COMMUNITY): Payer: Self-pay

## 2021-07-29 DIAGNOSIS — F988 Other specified behavioral and emotional disorders with onset usually occurring in childhood and adolescence: Secondary | ICD-10-CM | POA: Diagnosis not present

## 2021-07-29 MED ORDER — AMPHETAMINE-DEXTROAMPHET ER 30 MG PO CP24
ORAL_CAPSULE | ORAL | 0 refills | Status: DC
  Filled 2021-08-05 – 2021-08-06 (×2): qty 60, 30d supply, fill #0

## 2021-07-29 MED ORDER — AMPHETAMINE-DEXTROAMPHET ER 30 MG PO CP24
ORAL_CAPSULE | ORAL | 0 refills | Status: DC
  Filled 2021-09-02: qty 60, 30d supply, fill #0

## 2021-07-29 MED ORDER — AMPHETAMINE-DEXTROAMPHET ER 30 MG PO CP24
ORAL_CAPSULE | ORAL | 0 refills | Status: DC
  Filled 2021-10-03: qty 60, 30d supply, fill #0

## 2021-08-01 ENCOUNTER — Other Ambulatory Visit (HOSPITAL_COMMUNITY): Payer: Self-pay

## 2021-08-05 ENCOUNTER — Other Ambulatory Visit (HOSPITAL_COMMUNITY): Payer: Self-pay

## 2021-08-06 ENCOUNTER — Other Ambulatory Visit (HOSPITAL_COMMUNITY): Payer: Self-pay

## 2021-08-22 ENCOUNTER — Other Ambulatory Visit (HOSPITAL_COMMUNITY): Payer: Self-pay

## 2021-08-22 MED ORDER — MOUNJARO 15 MG/0.5ML ~~LOC~~ SOAJ
SUBCUTANEOUS | 0 refills | Status: DC
  Filled 2021-08-22: qty 2, 28d supply, fill #0

## 2021-08-23 ENCOUNTER — Other Ambulatory Visit (HOSPITAL_COMMUNITY): Payer: Self-pay

## 2021-09-02 ENCOUNTER — Other Ambulatory Visit (HOSPITAL_COMMUNITY): Payer: Self-pay

## 2021-09-05 ENCOUNTER — Other Ambulatory Visit (HOSPITAL_COMMUNITY): Payer: Self-pay

## 2021-09-15 ENCOUNTER — Other Ambulatory Visit (HOSPITAL_COMMUNITY): Payer: Self-pay

## 2021-09-15 MED ORDER — MOUNJARO 15 MG/0.5ML ~~LOC~~ SOAJ
SUBCUTANEOUS | 0 refills | Status: DC
  Filled 2021-09-15: qty 2, 28d supply, fill #0

## 2021-10-03 ENCOUNTER — Other Ambulatory Visit (HOSPITAL_COMMUNITY): Payer: Self-pay

## 2021-10-05 ENCOUNTER — Other Ambulatory Visit (HOSPITAL_COMMUNITY): Payer: Self-pay

## 2021-10-07 ENCOUNTER — Other Ambulatory Visit (HOSPITAL_COMMUNITY): Payer: Self-pay

## 2021-10-10 ENCOUNTER — Other Ambulatory Visit (HOSPITAL_COMMUNITY): Payer: Self-pay

## 2021-10-10 MED ORDER — MOUNJARO 15 MG/0.5ML ~~LOC~~ SOAJ
SUBCUTANEOUS | 0 refills | Status: DC
  Filled 2021-10-10 – 2021-11-14 (×2): qty 2, 28d supply, fill #0

## 2021-10-12 ENCOUNTER — Other Ambulatory Visit (HOSPITAL_COMMUNITY): Payer: Self-pay

## 2021-10-13 ENCOUNTER — Other Ambulatory Visit (HOSPITAL_COMMUNITY): Payer: Self-pay

## 2021-11-01 ENCOUNTER — Other Ambulatory Visit (HOSPITAL_COMMUNITY): Payer: Self-pay

## 2021-11-01 DIAGNOSIS — F17210 Nicotine dependence, cigarettes, uncomplicated: Secondary | ICD-10-CM | POA: Diagnosis not present

## 2021-11-01 DIAGNOSIS — F988 Other specified behavioral and emotional disorders with onset usually occurring in childhood and adolescence: Secondary | ICD-10-CM | POA: Diagnosis not present

## 2021-11-01 MED ORDER — AMPHETAMINE-DEXTROAMPHET ER 30 MG PO CP24
ORAL_CAPSULE | ORAL | 0 refills | Status: DC
  Filled 2021-12-01: qty 60, 30d supply, fill #0

## 2021-11-01 MED ORDER — AMPHETAMINE-DEXTROAMPHET ER 30 MG PO CP24
ORAL_CAPSULE | ORAL | 0 refills | Status: DC
  Filled 2021-11-01: qty 60, 30d supply, fill #0

## 2021-11-01 MED ORDER — AMPHETAMINE-DEXTROAMPHET ER 30 MG PO CP24
ORAL_CAPSULE | ORAL | 0 refills | Status: DC
  Filled 2021-12-29: qty 60, 30d supply, fill #0
  Filled ????-??-??: fill #0

## 2021-11-06 ENCOUNTER — Other Ambulatory Visit: Payer: Self-pay

## 2021-11-06 ENCOUNTER — Emergency Department (HOSPITAL_BASED_OUTPATIENT_CLINIC_OR_DEPARTMENT_OTHER)
Admission: EM | Admit: 2021-11-06 | Discharge: 2021-11-07 | Disposition: A | Discharge status: Home / Self Care | Payer: 59 | Attending: Emergency Medicine | Admitting: Emergency Medicine

## 2021-11-06 ENCOUNTER — Encounter (HOSPITAL_BASED_OUTPATIENT_CLINIC_OR_DEPARTMENT_OTHER): Payer: Self-pay | Admitting: Emergency Medicine

## 2021-11-06 ENCOUNTER — Emergency Department (HOSPITAL_BASED_OUTPATIENT_CLINIC_OR_DEPARTMENT_OTHER): Payer: 59

## 2021-11-06 DIAGNOSIS — F17210 Nicotine dependence, cigarettes, uncomplicated: Secondary | ICD-10-CM | POA: Insufficient documentation

## 2021-11-06 DIAGNOSIS — R079 Chest pain, unspecified: Secondary | ICD-10-CM | POA: Diagnosis present

## 2021-11-06 DIAGNOSIS — Z7982 Long term (current) use of aspirin: Secondary | ICD-10-CM | POA: Insufficient documentation

## 2021-11-06 DIAGNOSIS — I700 Atherosclerosis of aorta: Secondary | ICD-10-CM | POA: Diagnosis not present

## 2021-11-06 DIAGNOSIS — I160 Hypertensive urgency: Secondary | ICD-10-CM | POA: Diagnosis not present

## 2021-11-06 DIAGNOSIS — R0602 Shortness of breath: Secondary | ICD-10-CM | POA: Diagnosis not present

## 2021-11-06 DIAGNOSIS — R0789 Other chest pain: Secondary | ICD-10-CM | POA: Diagnosis not present

## 2021-11-06 LAB — BASIC METABOLIC PANEL WITH GFR
Anion gap: 13 (ref 5–15)
BUN: 12 mg/dL (ref 6–20)
CO2: 25 mmol/L (ref 22–32)
Calcium: 10.9 mg/dL — ABNORMAL HIGH (ref 8.9–10.3)
Chloride: 103 mmol/L (ref 98–111)
Creatinine, Ser: 0.7 mg/dL (ref 0.44–1.00)
GFR, Estimated: >60 mL/min
Glucose, Bld: 83 mg/dL (ref 70–99)
Potassium: 3.7 mmol/L (ref 3.5–5.1)
Sodium: 141 mmol/L (ref 135–145)

## 2021-11-06 LAB — TROPONIN I (HIGH SENSITIVITY)
Troponin I (High Sensitivity): 3 ng/L
Troponin I (High Sensitivity): <2 ng/L

## 2021-11-06 LAB — CBC
HCT: 43.2 % (ref 36.0–46.0)
Hemoglobin: 15 g/dL (ref 12.0–15.0)
MCH: 31.6 pg (ref 26.0–34.0)
MCHC: 34.7 g/dL (ref 30.0–36.0)
MCV: 90.9 fL (ref 80.0–100.0)
Platelets: 317 10*3/uL (ref 150–400)
RBC: 4.75 MIL/uL (ref 3.87–5.11)
RDW: 13.5 % (ref 11.5–15.5)
WBC: 10.1 10*3/uL (ref 4.0–10.5)
nRBC: 0 % (ref 0.0–0.2)

## 2021-11-06 MED ORDER — NITROGLYCERIN 0.4 MG SL SUBL
0.4000 mg | SUBLINGUAL_TABLET | SUBLINGUAL | Status: DC | PRN
  Administered 2021-11-06: 0.4 mg via SUBLINGUAL
  Filled 2021-11-06: qty 1

## 2021-11-06 MED ORDER — IOHEXOL 350 MG/ML SOLN
100.0000 mL | Freq: Once | INTRAVENOUS | Status: AC | PRN
  Administered 2021-11-06: 75 mL via INTRAVENOUS

## 2021-11-06 MED ORDER — ONDANSETRON HCL 4 MG/2ML IJ SOLN: 4.0000 mg | Freq: Once | INTRAMUSCULAR | Status: DC

## 2021-11-06 MED ORDER — HYDRALAZINE HCL 20 MG/ML IJ SOLN
5.0000 mg | Freq: Once | INTRAMUSCULAR | Status: AC
  Administered 2021-11-06: 5 mg via INTRAVENOUS
  Filled 2021-11-06: qty 1

## 2021-11-06 NOTE — ED Triage Notes (Signed)
Pt reports elevated BP intermittently today with episodes of SOB, reports pain to left shoulder blade

## 2021-11-06 NOTE — ED Provider Notes (Signed)
[facility] EMERGENCY DEPT Provider Note   CSN: [id number] Arrival date & time: 11/06/21  2030     History  Chief Complaint  Patient presents with   Shortness of Breath    [name] is a 46 y.o. female.  Patient is a 46 year old female with a history of ADD who is presenting today with complaint of chest pain, dizziness and shortness of breath.  Patient reports she woke up this morning and felt her normal self.  She went to the gym around noon and did some lifting and then walked at an incline on a treadmill for about 20 minutes.  She reports after finishing she noticed she had some pain in her lower back and then by the time she got home this afternoon about 1230 the pain was becoming a little more pronounced and then she developed pain in her left shoulder blade around 3:00 that started radiating around into her chest causing her to feel a bit short of breath.  She describes it as a heaviness and pressure in her chest and now she starting to feel a bit nauseated.  The dizziness is also continued.  She has had no syncope, cough, vomiting or diarrhea.  She denies any numbness or tingling in her arms or legs.  Touching the area in the shoulder that hurts her or taking a deep breath does not seem to significantly make the pain any worse.  When all this was going on at home she checked her blood pressure and it was initially elevated at 147/90 but then several hours later she checked it and it was 782 systolic.  She reports never having a history of elevated blood pressure.  She saw her doctor 2 months ago and her blood pressure was normal at 120/70 at that time.  She does smoke cigarettes daily but denies significant alcohol use.  She has no prior history of cardiac disease and no significant family history.  The history is provided by the patient.  Shortness of Breath      Home Medications Prior to Admission medications   Medication Sig Start Date End Date Taking?  Authorizing Provider  ADDERALL XR 30 MG 24 hr capsule Take 60 mg by mouth daily. 08/07/17   [provider]  amphetamine-dextroamphetamine (ADDERALL XR) 30 MG 24 hr capsule TAKE 2 CAPSULES BY MOUTH EVERY MORNING 06/24/20 12/21/20  Lanelle Bal, PA-C  amphetamine-dextroamphetamine (ADDERALL XR) 30 MG 24 hr capsule TAKE 2 CAPSULES BY MOUTH EVERY MORNING 05/27/20 11/23/20  Lanelle Bal, PA-C  amphetamine-dextroamphetamine (ADDERALL XR) 30 MG 24 hr capsule TAKE 2 CAPSULES BY MOUTH EVERY MORNING **CAN FILL 3.12.22 05/27/20 11/23/20    amphetamine-dextroamphetamine (ADDERALL XR) 30 MG 24 hr capsule TAKE 2 CAPSULES BY MOUTH EVERY MORNING **CAN FILL 07/26/20 05/27/20 11/23/20  Lanelle Bal, PA-C  amphetamine-dextroamphetamine (ADDERALL XR) 30 MG 24 hr capsule TAKE 2 CAPSULES BY MOUTH EVERY MORNING DO NOT FILL UNTIL 04/30/20 03/01/20 08/28/20  Lanelle Bal, PA-C  amphetamine-dextroamphetamine (ADDERALL XR) 30 MG 24 hr capsule TAKE 2 CAPSULES BY MOUTH EVERY MORNING DO NOT FILL UNTIL 03/31/20 03/01/20 08/28/20  Lanelle Bal, PA-C  amphetamine-dextroamphetamine (ADDERALL XR) 30 MG 24 hr capsule TAKE 2 CAPSULES BY MOUTH EVERY MORNING 03/01/20 08/28/20  Lanelle Bal, PA-C  amphetamine-dextroamphetamine (ADDERALL XR) 30 MG 24 hr capsule Take 2 capules by mouth once a day 08/19/20     amphetamine-dextroamphetamine (ADDERALL XR) 30 MG 24 hr capsule Take 2 capsules by mouth in the morning  **09/18/20** 09/18/20  amphetamine-dextroamphetamine (ADDERALL XR) 30 MG 24 hr capsule Take 2 capsules by mouth in the morning 10/15/20     amphetamine-dextroamphetamine (ADDERALL XR) 30 MG 24 hr capsule Take 2 capsules by mouth every morning. 11/09/20     amphetamine-dextroamphetamine (ADDERALL XR) 30 MG 24 hr capsule Take 2 capsules by mouth every morning 02/06/21     amphetamine-dextroamphetamine (ADDERALL XR) 30 MG 24 hr capsule Take 2 capsules by mouth every morning (12/08/20) 12/08/20     amphetamine-dextroamphetamine (ADDERALL XR) 30  MG 24 hr capsule Take 2 capsules by mouth every morning (01/07/21) 01/07/21     amphetamine-dextroamphetamine (ADDERALL XR) 30 MG 24 hr capsule Take 2 capsules by mouth every morning. 03/05/21     amphetamine-dextroamphetamine (ADDERALL XR) 30 MG 24 hr capsule Take 2 capsules by mouth every morning. 02/03/21     amphetamine-dextroamphetamine (ADDERALL XR) 30 MG 24 hr capsule Take 2 capsules by mouth every morning. May fill 04/04/21 04/04/21     amphetamine-dextroamphetamine (ADDERALL XR) 30 MG 24 hr capsule Take 2 capsules by mouth every morning 05/04/21     amphetamine-dextroamphetamine (ADDERALL XR) 30 MG 24 hr capsule Take 2 capsules by mouth every morning. (06/03/21) 06/03/21     amphetamine-dextroamphetamine (ADDERALL XR) 30 MG 24 hr capsule Take 2 capsules by mouth every morning 07/29/21     amphetamine-dextroamphetamine (ADDERALL XR) 30 MG 24 hr capsule Take 2 capsules by mouth every morning * Do not fill before 08/28/21 08/28/21     amphetamine-dextroamphetamine (ADDERALL XR) 30 MG 24 hr capsule Take 2 capsules by mouth every morning 11/01/21     amphetamine-dextroamphetamine (ADDERALL XR) 30 MG 24 hr capsule Take 2 capsules by mouth every morning (12/01/21) 12/01/21     amphetamine-dextroamphetamine (ADDERALL XR) 30 MG 24 hr capsule Take 2 capsules by mouth every morning (12/31/21) 12/31/21     aspirin-acetaminophen-caffeine (EXCEDRIN MIGRAINE) [phone] MG tablet Take 2 tablets by mouth 2 (two) times daily as needed for headache.    [provider]  fluticasone (FLONASE) 50 MCG/ACT nasal spray Place 1 spray into both nostrils daily as needed for allergies or rhinitis.    [provider]  MOUNJARO 10 MG/0.5ML Pen Inject 10 mg into the skin  every week 03/31/21     MOUNJARO 15 MG/0.5ML Pen Inject 15 mg subcutaneously every week 07/03/21     MOUNJARO 15 MG/0.5ML Pen Inject 15 mg into the skin once every week 07/26/21     MOUNJARO 15 MG/0.5ML Pen Inject 15 mg under the skin every week  08/20/21     MOUNJARO 15 MG/0.5ML Pen Inject 1 pen into the skin every week 09/15/21     MOUNJARO 15 MG/0.5ML Pen Inject 1 pen (15 mg) into the skin once every week 10/09/21     MOUNJARO 7.5 MG/0.5ML Pen Inject 1 pen (7.5 mg) into the skin every week 01/08/21     potassium chloride (K-DUR) 10 MEQ tablet Take 2 tablets (20 mEq total) by mouth 2 (two) times daily. Patient not taking: Reported on 09/25/2017 08/16/17   Aviva Signs, MD  predniSONE (DELTASONE) 20 MG tablet Take 2 Tablet(s) by mouth every day 11/17/20     tirzepatide [facility]) 2.5 MG/0.5ML Pen Inject 1 syringe (2.'5mg'$ ) subcutaneously once a week 11/23/20     tirzepatide [facility]) 5 MG/0.5ML Pen Inject 1 syringe ('5mg'$ ) into the skin every week. 12/16/20     traMADol (ULTRAM) 50 MG tablet Take 1 tablet (50 mg total) by mouth every 6 (six) hours as needed. Patient not taking:  Reported on 09/25/2017 08/21/17   Aviva Signs, MD  traMADol (ULTRAM) 50 MG tablet Take 1 tablet (50 mg total) by mouth every 6 (six) hours as needed. 09/25/17   Aviva Signs, MD      Allergies    Amoxicillin, Dilaudid [hydromorphone hcl], and Tylox [oxycodone-acetaminophen]    Review of Systems   Review of Systems  Respiratory:  Positive for shortness of breath.     Physical Exam Updated Vital Signs BP (!) 181/100   Pulse 60   Temp 98.1 F (36.7 C)   Resp 13   Ht '5\' 6"'$  (1.676 m)   Wt 69.9 kg   SpO2 100%   BMI 24.86 kg/m  Physical Exam Vitals and nursing note reviewed.  Constitutional:      Appearance: She is well-developed.     Comments: Appears mildly uncomfortable  HENT:     Head: Normocephalic and atraumatic.  Eyes:     Pupils: Pupils are equal, round, and reactive to light.  Cardiovascular:     Rate and Rhythm: Normal rate and regular rhythm.     Pulses: Normal pulses.     Heart sounds: Normal heart sounds. No murmur heard.    No friction rub.  Pulmonary:     Effort: Pulmonary effort is normal.     Breath sounds: Normal breath sounds. No wheezing  or rales.  Abdominal:     General: Bowel sounds are normal. There is no distension.     Palpations: Abdomen is soft.     Tenderness: There is no abdominal tenderness. There is no guarding or rebound.  Musculoskeletal:        General: No tenderness. Normal range of motion.     Right lower leg: No edema.     Left lower leg: No edema.     Comments: No edema.  No pain with palpation of the left scapula or shoulder where she is describing the pain  Skin:    General: Skin is warm and dry.     Findings: No rash.  Neurological:     Mental Status: She is alert and oriented to person, place, and time. Mental status is at baseline.     Cranial Nerves: No cranial nerve deficit.     Sensory: No sensory deficit.     Motor: No weakness.  Psychiatric:        Mood and Affect: Mood normal.        Behavior: Behavior normal.     ED Results / Procedures / Treatments   Labs (all labs ordered are listed, but only abnormal results are displayed) Labs Reviewed  BASIC METABOLIC PANEL - Abnormal; Notable for the following components:      Result Value   Calcium 10.9 (*)    All other components within normal limits  CBC  TROPONIN I (HIGH SENSITIVITY)  TROPONIN I (HIGH SENSITIVITY)    EKG EKG Interpretation  Date/Time:  Sunday November 06 2021 22:13:03 EDT Ventricular Rate:  64 PR Interval:  213 QRS Duration: 113 QT Interval:  441 QTC Calculation: 455 R Axis:   -73 Text Interpretation: Normal sinus rhythm Prolonged PR interval Left anterior fascicular block Anteroseptal infarct, age indeterminate Confirmed by Thayer Jew [phone]) on 11/06/2021 11:01:31 PM  Radiology CT Angio Chest/Abd/Pel for Dissection W and/or Wo Contrast  Result Date: 11/06/2021 CLINICAL DATA:  Chest or back pain, aortic dissection suspected. EXAM: CT ANGIOGRAPHY CHEST, ABDOMEN AND PELVIS TECHNIQUE: Non-contrast CT of the chest was initially obtained. Multidetector CT imaging through the  chest, abdomen and pelvis was  performed using the standard protocol during bolus administration of intravenous contrast. Multiplanar reconstructed images and MIPs were obtained and reviewed to evaluate the vascular anatomy. RADIATION DOSE REDUCTION: This exam was performed according to the departmental dose-optimization program which includes automated exposure control, adjustment of the mA and/or kV according to patient size and/or use of iterative reconstruction technique. CONTRAST:  100 mL OMNIPAQUE IOHEXOL 350 MG/ML SOLN COMPARISON:  02/03/2011. FINDINGS: CTA CHEST FINDINGS Cardiovascular: The heart is normal in size and there is no pericardial effusion. Scattered coronary artery calcifications are noted. There is minimal atherosclerotic calcification of the aorta without evidence of aneurysm or dissection. The pulmonary trunk is normal in caliber. Mediastinum/Nodes: No enlarged mediastinal, hilar, or axillary lymph nodes. Thyroid gland, trachea, and esophagus demonstrate no significant findings. Lungs/Pleura: Lungs are clear. No pleural effusion or pneumothorax. Musculoskeletal: Cervical spinal fusion hardware is noted. Degenerative changes are present in the thoracic spine. No acute or suspicious osseous abnormality. Review of the MIP images confirms the above findings. CTA ABDOMEN AND PELVIS FINDINGS VASCULAR Aorta: Aortic atherosclerosis. Normal caliber aorta without aneurysm, dissection, vasculitis or significant stenosis. Celiac: Patent without evidence of aneurysm, dissection, vasculitis or significant stenosis. SMA: Patent without evidence of aneurysm, dissection, vasculitis or significant stenosis. Renals: Both renal arteries are patent without evidence of aneurysm, dissection, vasculitis, fibromuscular dysplasia or significant stenosis. IMA: Patent without evidence of aneurysm, dissection, vasculitis or significant stenosis. Inflow: Patent without evidence of aneurysm, dissection, vasculitis or significant stenosis. Veins: No  obvious venous abnormality within the limitations of this arterial phase study. Review of the MIP images confirms the above findings. NON-VASCULAR Hepatobiliary: No focal liver abnormality is seen. Hepatic steatosis is noted. No gallstones, gallbladder wall thickening, or biliary dilatation. Pancreas: Unremarkable. No pancreatic ductal dilatation or surrounding inflammatory changes. Spleen: Normal in size without focal abnormality. Adrenals/Urinary Tract: Adrenal glands are unremarkable. Kidneys are normal, without renal calculi, focal lesion, or hydronephrosis. Bladder is unremarkable. Stomach/Bowel: Stomach is within normal limits. Appendix appears normal. No evidence of bowel wall thickening, distention, or inflammatory changes. No free air or pneumatosis. Lymphatic: No abdominal or pelvic lymphadenopathy. Reproductive: Status post hysterectomy. No adnexal masses. Other: No abdominopelvic ascites. There is evidence of prior hernia repair in the anterior abdominal wall Musculoskeletal: Degenerative changes are present in the lumbar spine. No acute osseous abnormality. Review of the MIP images confirms the above findings. IMPRESSION: 1. Mild aortic atherosclerosis without evidence of aneurysm or dissection. 2. No acute process in the chest, abdomen, or pelvis. Electronically Signed   By: Brett Fairy M.D.   On: 11/06/2021 22:30   DG Chest Portable 1 View  Result Date: 11/06/2021 CLINICAL DATA:  Shortness of breath EXAM: PORTABLE CHEST 1 VIEW COMPARISON:  04/02/2009 FINDINGS: The heart size and mediastinal contours are within normal limits. Both lungs are clear. The visualized skeletal structures are unremarkable. IMPRESSION: No active disease. Electronically Signed   By: Ulyses Jarred M.D.   On: 11/06/2021 21:54    Procedures Procedures    Medications Ordered in ED Medications  nitroGLYCERIN (NITROSTAT) SL tablet 0.4 mg (0.4 mg Sublingual Given 11/06/21 2132)  ondansetron (ZOFRAN) injection 4 mg (has  no administration in time range)  hydrALAZINE (APRESOLINE) injection 5 mg (has no administration in time range)  iohexol (OMNIPAQUE) 350 MG/ML injection 100 mL (75 mLs Intravenous Contrast Given 11/06/21 2204)    ED Course/ Medical Decision Making/ A&P  Medical Decision Making Amount and/or Complexity of Data Reviewed Labs: ordered. Decision-making details documented in ED Course. Radiology: ordered and independent interpretation performed. Decision-making details documented in ED Course. ECG/medicine tests: ordered and independent interpretation performed. Decision-making details documented in ED Course.  Risk Prescription drug management. Decision regarding hospitalization.   Pt with multiple medical problems and comorbidities and presenting today with a complaint that caries a high risk for morbidity and mortality.  Patient is here today with complaints of left shoulder chest pain and some lower back pain.  She is having some dizziness, mild nausea and shortness of breath.  Patient has significantly elevated blood pressure today 175/102 and 180/106 in bilateral arms.  Her pulses do feel normal and equal.  Concern for ACS versus aortic dissection vs PTX.  Low suspicion for PE.  Could also be indigestion with associated anxiety but patient does not have a history of hypertension.  I independently interpreted patient's EKG  Patient did been nitroglycerin for blood pressure control and to see if it improves pain.  Feel that patient needs CTA of the chest abdomen pelvis for further evaluation patient's elevated blood pressure and history of significant tobacco use.  11:15 PM I independently interpreted patient's labs and she has a normal BMP, CBC, troponin.  I have independently visualized and interpreted pt's images today.  Chest x-ray with no acute findings.  No findings suspicious for pneumothorax or lung lesions.  After NTG  BP improved to 165/90 and shoulder pain is  now gone but still some nausea.  CTA pending.  11:15 PM CTA is neg for dissection or other acute process. Pt's pain is improved but pt still hypertensive without definitive cause.  Will give hydralazine due to low heart rate not a candidate for labetalol.  Heart score of 4. Will admit for hypertensive urgency and CP r/o.  Findings discussed with the patient and her daughter.  They are comfortable with this plan. CRITICAL CARE Performed by: Almee Pelphrey Total critical care time: 30 minutes Critical care time was exclusive of separately billable procedures and treating other patients. Critical care was necessary to treat or prevent imminent or life-threatening deterioration. Critical care was time spent personally by me on the following activities: development of treatment plan with patient and/or surrogate as well as nursing, discussions with consultants, evaluation of patient's response to treatment, examination of patient, obtaining history from patient or surrogate, ordering and performing treatments and interventions, ordering and review of laboratory studies, ordering and review of radiographic studies, pulse oximetry and re-evaluation of patient's condition.         Final Clinical Impression(s) / ED Diagnoses Final diagnoses:  Hypertensive urgency  Chest pain, unspecified type    Rx / DC Orders ED Discharge Orders     None         Blanchie Dessert, MD 11/06/21 2315

## 2021-11-07 NOTE — ED Provider Notes (Signed)
Patient awaiting bed placement, not available at this time.  Patient observed overnight patient is chest pain-free vital signs normalized.  Normal blood pressure on recheck.  Patient has no chest pain or chest pressure or shortness of breath.  Patient requesting if she could follow-up outpatient.  Texted with hospitalist to update plan for discharge.  Urgent consult/referral to cardiology.  Patient comfortable this plan.  Golda Acre, MD 11/07/21 1131

## 2021-11-07 NOTE — Discharge Instructions (Signed)
Follow-up closely with cardiology.  Take baby aspirin a day until cleared.  Return for new concerns.

## 2021-11-10 ENCOUNTER — Other Ambulatory Visit (HOSPITAL_COMMUNITY): Payer: Self-pay

## 2021-11-10 MED ORDER — WEGOVY 1.7 MG/0.75ML ~~LOC~~ SOAJ
SUBCUTANEOUS | 0 refills | Status: DC
  Filled 2021-11-10: qty 3, 28d supply, fill #0

## 2021-11-11 ENCOUNTER — Other Ambulatory Visit (HOSPITAL_COMMUNITY): Payer: Self-pay

## 2021-11-14 ENCOUNTER — Other Ambulatory Visit (HOSPITAL_COMMUNITY)
Admission: RE | Admit: 2021-11-14 | Discharge: 2021-11-14 | Disposition: A | Discharge status: Home / Self Care | Payer: 59 | Source: Ambulatory Visit | Attending: Internal Medicine | Admitting: Internal Medicine

## 2021-11-14 ENCOUNTER — Other Ambulatory Visit (HOSPITAL_COMMUNITY): Payer: Self-pay

## 2021-11-14 ENCOUNTER — Encounter: Payer: Self-pay | Admitting: Internal Medicine

## 2021-11-14 ENCOUNTER — Ambulatory Visit: Payer: 59 | Admitting: Internal Medicine

## 2021-11-14 VITALS — BP 140/96 | HR 66 | Ht 66.0 in | Wt 146.4 lb

## 2021-11-14 DIAGNOSIS — Z8639 Personal history of other endocrine, nutritional and metabolic disease: Secondary | ICD-10-CM

## 2021-11-14 DIAGNOSIS — R002 Palpitations: Secondary | ICD-10-CM

## 2021-11-14 DIAGNOSIS — I2510 Atherosclerotic heart disease of native coronary artery without angina pectoris: Secondary | ICD-10-CM

## 2021-11-14 LAB — TSH: TSH: 4.624 u[IU]/mL — ABNORMAL HIGH (ref 0.350–4.500)

## 2021-11-14 LAB — HEMOGLOBIN A1C
Hgb A1c MFr Bld: 4.6 % — ABNORMAL LOW (ref 4.8–5.6)
Mean Plasma Glucose: 85.32 mg/dL

## 2021-11-14 MED ORDER — AMLODIPINE BESYLATE 5 MG PO TABS
5.0000 mg | ORAL_TABLET | Freq: Every day | ORAL | 3 refills | Status: DC
  Filled 2021-11-14: qty 90, 90d supply, fill #0
  Filled 2022-02-27: qty 90, 90d supply, fill #1
  Filled 2022-06-18: qty 90, 90d supply, fill #2
  Filled 2022-10-02: qty 90, 90d supply, fill #3

## 2021-11-14 NOTE — Patient Instructions (Signed)
Medication Instructions:   Start Norvasc 5 mg Daily   *If you need a refill on your cardiac medications before your next appointment, please call your pharmacy*   Lab Work: Your physician recommends that you return for lab work in: Today   If you have labs (blood work) drawn today and your tests are completely normal, you will receive your results only by: [place] (if you have MyChart) OR A paper copy in the mail If you have any lab test that is abnormal or we need to change your treatment, we will call you to review the results.   Testing/Procedures: NONE    Follow-Up: At [facility], you and your health needs are our priority.  As part of our continuing mission to provide you with exceptional heart care, we have created designated Provider Care Teams.  These Care Teams include your primary Cardiologist (physician) and Advanced Practice Providers (APPs -  Physician Assistants and Nurse Practitioners) who all work together to provide you with the care you need, when you need it.  We recommend signing up for the patient portal called "MyChart".  Sign up information is provided on this After Visit Summary.  MyChart is used to connect with patients for Virtual Visits (Telemedicine).  Patients are able to view lab/test results, encounter notes, upcoming appointments, etc.  Non-urgent messages can be sent to your provider as well.   To learn more about what you can do with MyChart, go to [email or web address].    Your next appointment:   4-6 week(s)  The format for your next appointment:   In Person  Provider:   Dorris Carnes, MD    Other Instructions Thank you for choosing [facility]!    Important Information About Sugar

## 2021-11-14 NOTE — Progress Notes (Signed)
Cardiology Office Note   Date:  11/14/2021   ID:  [name], DOB 05-29-1975, MRN [id number]  PCP:  Rory Percy, MD  Cardiologist:   Dorris Carnes, MD   Patient referred from ED for evaluation of HTN      History of Present Illness: [name] is a 46 y.o. female with no prior cardiac hx   ON 7/24 she was at gym  Developed L sided low back pain    Went home   Says she could feel her heart beating harder      Checked BP and it was 150/105    [facility] ER  ALos had some upper back pain      In ED   BP was elevated     Because of back symptoms she had a CT angio of chest /abdomen.    Aorta with mild calcifications   No dissection   No RAS     Sent home since no beds for follow up  Since leaving the ED the pt says her BP has been mainly in 140s to 150s /90s     She denies CP   No SOB   She has not been back to gym  Smokes about 1 ppd since age 73     Over past year has lost over 100lbs     Diet    Br   high protein yoguer  25 g     Dinner  Meats/ veggies    Low carb      Current Meds  Medication Sig   [START ON 12/01/2021] amphetamine-dextroamphetamine (ADDERALL XR) 30 MG 24 hr capsule Take 2 capsules by mouth every morning (12/01/21)   MOUNJARO 15 MG/0.5ML Pen Inject 1 pen (15 mg) into the skin once every week   WEGOVY 1.7 MG/0.75ML SOAJ Inject 1.7 mg under the skin once weekly for weight loss     Allergies:   Amoxicillin, Dilaudid [hydromorphone hcl], and Tylox [oxycodone-acetaminophen]   Past Medical History:  Diagnosis Date   ADD (attention deficit disorder)    Breast disorder    fibroadenoma   PONV (postoperative nausea and vomiting)     Past Surgical History:  Procedure Laterality Date   ABDOMINAL HYSTERECTOMY     BREAST BIOPSY Left 08/21/2017   Procedure: BREAST BIOPSY X 2;  Surgeon: Aviva Signs, MD;  Location: [facility];  Service: General;  Laterality: Left;   [place] and 2008   x2   CYSTO     pt denies   HERNIA REPAIR      x3   VAGINAL HYSTERECTOMY     total   WRIST GANGLION EXCISION Right      Social History:  The patient  reports that she has been smoking cigarettes. She has a 25.00 pack-year smoking history. She has never used smokeless tobacco. She reports current alcohol use. She reports that she does not use drugs.   Family History:  The patient's family history includes Alzheimer's disease in her mother; CAD in an other family member; Cancer in her maternal grandmother and mother; Diabetes in her mother; Hypertension in her father.    ROS:  Please see the history of present illness. All other systems are reviewed and  Negative to the above problem except as noted.    PHYSICAL EXAM: VS:  BP (!) 140/96   Pulse 66   Ht '5\' 6"'$  (1.676 m)   Wt 146 lb 6.4 oz (66.4 kg)  SpO2 99%   BMI 23.63 kg/m   GEN: Well nourished, well developed, in no acute distress  HEENT: normal  Neck: no JVD, carotid bruits, Cardiac: RRR; no murmurs,  no edema  Respiratory:  clear to auscultation bilaterally,  GI: soft, nontender, nondistended, + BS  No hepatomegaly  MS: no deformity Moving all extremities   Skin: warm and dry, no rash Neuro:  Strength and sensation are intact Psych: euthymic mood, full affect   EKG:  EKG is not ordered today.  In ED on 11/06/21 2 EKGS done   ? Different lead placement    Second  NSR   64   First degree AV block  PR 213 msec   ANterior ME      Lipid Panel No results found for: "CHOL", "TRIG", "HDL", "CHOLHDL", "VLDL", "LDLCALC", "LDLDIRECT"    Wt Readings from Last 3 Encounters:  11/14/21 146 lb 6.4 oz (66.4 kg)  11/06/21 154 lb (69.9 kg)  09/25/17 219 lb (99.3 kg)      ASSESSMENT AND PLAN:  1  HTN   BP is elevated      Renal arteries look good on CT scan    On my check was 130/96    Would recomm 5 mg amlodipine to regimen   Follow at home  See back in clinci in 4 to 6 wks  2  Atherosclerosis   Mild plaquing noted on CT scan  Will get lipomed Should be on an agent to  optimize  3  Tob abuse    Counselled in cutting bac    F/U in 4 to 6 wks     Current medicines are reviewed at length with the patient today.  The patient does not have concerns regarding medicines.  Signed, Dorris Carnes, MD  11/14/2021 9:23 AM    [address], [place], [place]  [address] Phone: [phone]; Fax: [phone]

## 2021-11-15 LAB — NMR, LIPOPROFILE
Cholesterol, Total: 190 mg/dL (ref 100–199)
HDL Cholesterol by NMR: 57 mg/dL (ref >39)
HDL Particle Number: 37.3 umol/L (ref >=30.5)
LDL Particle Number: 1259 nmol/L — ABNORMAL HIGH (ref <1000)
LDL Size: 21.1 nm (ref >20.5)
LDL-C (NIH Calc): 119 mg/dL — ABNORMAL HIGH (ref 0–99)
LP-IR Score: <25 (ref <=45)
Small LDL Particle Number: 456 nmol/L (ref <=527)
Triglycerides by NMR: 78 mg/dL (ref 0–149)

## 2021-11-17 ENCOUNTER — Other Ambulatory Visit (HOSPITAL_COMMUNITY): Payer: Self-pay

## 2021-11-17 ENCOUNTER — Telehealth: Payer: Self-pay

## 2021-11-17 DIAGNOSIS — Z79899 Other long term (current) drug therapy: Secondary | ICD-10-CM

## 2021-11-17 DIAGNOSIS — I2510 Atherosclerotic heart disease of native coronary artery without angina pectoris: Secondary | ICD-10-CM

## 2021-11-17 DIAGNOSIS — R002 Palpitations: Secondary | ICD-10-CM

## 2021-11-17 MED ORDER — ROSUVASTATIN CALCIUM 20 MG PO TABS
20.0000 mg | ORAL_TABLET | Freq: Every day | ORAL | 3 refills | Status: AC
  Filled 2021-11-17 – 2021-12-01 (×2): qty 90, 90d supply, fill #0
  Filled 2022-02-27: qty 90, 90d supply, fill #1
  Filled 2022-06-18: qty 90, 90d supply, fill #2

## 2021-11-17 NOTE — Telephone Encounter (Signed)
-----   Message from Stephani Police, RN sent at 11/16/2021  9:29 AM EDT -----  ----- Message ----- From: Nuala Alpha, LPN Sent: 05/20/3359   7:48 AM EDT To: Stephani Police, RN   ----- Message ----- From: Fay Records, MD Sent: 11/15/2021   8:58 PM EDT To: Rebec[facility] Triage  1.  LDL   Elevated   With plaquing on aorta I would recomm adding Crestor to regimen to get down   Goal less than 70   Start with 20 mg   F/Ulipomed in 8 wks   with liver panel 2   TSH is minimally elevated     I would repeat in 2 months with free  T3, free T4

## 2021-11-17 NOTE — Telephone Encounter (Signed)
Patient notified and verbalized understanding. Patient had no questions or concerns at this time. Patient agreeable with medication therapy and repeat lab work. PCP copied.

## 2021-11-28 ENCOUNTER — Other Ambulatory Visit (HOSPITAL_COMMUNITY): Payer: Self-pay

## 2021-11-28 MED ORDER — WEGOVY 2.4 MG/0.75ML ~~LOC~~ SOAJ
SUBCUTANEOUS | 0 refills | Status: DC
  Filled 2021-11-28: qty 3, 28d supply, fill #0

## 2021-11-30 ENCOUNTER — Other Ambulatory Visit (HOSPITAL_COMMUNITY): Payer: Self-pay

## 2021-12-01 ENCOUNTER — Other Ambulatory Visit (HOSPITAL_COMMUNITY): Payer: Self-pay

## 2021-12-23 ENCOUNTER — Other Ambulatory Visit (HOSPITAL_COMMUNITY): Payer: Self-pay

## 2021-12-23 MED ORDER — WEGOVY 2.4 MG/0.75ML ~~LOC~~ SOAJ
SUBCUTANEOUS | 0 refills | Status: DC
  Filled 2021-12-23: qty 3, 28d supply, fill #0

## 2021-12-28 ENCOUNTER — Other Ambulatory Visit (HOSPITAL_COMMUNITY): Payer: Self-pay

## 2021-12-29 ENCOUNTER — Other Ambulatory Visit (HOSPITAL_COMMUNITY): Payer: Self-pay

## 2022-01-23 ENCOUNTER — Other Ambulatory Visit (HOSPITAL_BASED_OUTPATIENT_CLINIC_OR_DEPARTMENT_OTHER): Payer: Self-pay

## 2022-01-23 DIAGNOSIS — F988 Other specified behavioral and emotional disorders with onset usually occurring in childhood and adolescence: Secondary | ICD-10-CM | POA: Diagnosis not present

## 2022-01-23 DIAGNOSIS — I10 Essential (primary) hypertension: Secondary | ICD-10-CM | POA: Diagnosis not present

## 2022-01-23 MED ORDER — AMPHETAMINE-DEXTROAMPHET ER 30 MG PO CP24
60.0000 mg | ORAL_CAPSULE | Freq: Every morning | ORAL | 0 refills | Status: DC
  Filled 2022-02-22 – 2022-02-23 (×2): qty 60, 30d supply, fill #0

## 2022-01-23 MED ORDER — AMPHETAMINE-DEXTROAMPHET ER 30 MG PO CP24: 60.0000 mg | ORAL_CAPSULE | Freq: Every morning | ORAL | 0 refills | Status: DC

## 2022-01-23 MED ORDER — AMPHETAMINE-DEXTROAMPHET ER 30 MG PO CP24
60.0000 mg | ORAL_CAPSULE | Freq: Every morning | ORAL | 0 refills | Status: DC
  Filled 2022-01-26: qty 60, 30d supply, fill #0

## 2022-01-26 ENCOUNTER — Other Ambulatory Visit (HOSPITAL_BASED_OUTPATIENT_CLINIC_OR_DEPARTMENT_OTHER): Payer: Self-pay

## 2022-02-02 ENCOUNTER — Other Ambulatory Visit (HOSPITAL_COMMUNITY): Payer: Self-pay

## 2022-02-02 MED ORDER — WEGOVY 2.4 MG/0.75ML ~~LOC~~ SOAJ
2.4000 mg | SUBCUTANEOUS | 0 refills | Status: DC
  Filled 2022-02-02: qty 3, 28d supply, fill #0

## 2022-02-03 ENCOUNTER — Other Ambulatory Visit (HOSPITAL_COMMUNITY): Payer: Self-pay

## 2022-02-13 ENCOUNTER — Other Ambulatory Visit (HOSPITAL_BASED_OUTPATIENT_CLINIC_OR_DEPARTMENT_OTHER): Payer: Self-pay

## 2022-02-13 DIAGNOSIS — Z23 Encounter for immunization: Secondary | ICD-10-CM | POA: Diagnosis not present

## 2022-02-13 MED ORDER — ACYCLOVIR 800 MG PO TABS
800.0000 mg | ORAL_TABLET | Freq: Three times a day (TID) | ORAL | 0 refills | Status: DC
  Filled 2022-02-13: qty 30, 10d supply, fill #0

## 2022-02-21 ENCOUNTER — Ambulatory Visit: Payer: 59 | Admitting: Student

## 2022-02-21 NOTE — Progress Notes (Deleted)
Cardiology Office Note    Date:  02/21/2022   ID:  [name], DOB 05-16-1975, MRN [id number]  PCP:  Rory Percy, MD  Cardiologist: None    No chief complaint on file.   History of Present Illness:    [name] is a 46 y.o. female with past medical history of fibroadenoma and tobacco use who presents to the office today for 29-monthfollow-up.   She was examined by Dr. RHarrington Challengerin 10/2021 as a new patient referral [facility]. Work-up during her ED evaluation had been reassuring. At the time of her follow-up visit, she reported her BP had remained elevated and with systolic readings in the 1546'Fto 150's when checked at home but denied any recent chest pain or dyspnea on exertion. She was started on Amlodipine '5mg'$  daily. Given her aortic atherosclerosis, a Lipid Panel was obtained and given LDL at 119, she was started on Crestor '20mg'$  daily.   - FLP/LFT's  - TSH, Free T3/T4    Past Medical History:  Diagnosis Date   ADD (attention deficit disorder)    Breast disorder    fibroadenoma   PONV (postoperative nausea and vomiting)     Past Surgical History:  Procedure Laterality Date   ABDOMINAL HYSTERECTOMY     BREAST BIOPSY Left 08/21/2017   Procedure: BREAST BIOPSY X 2;  Surgeon: JAviva Signs MD;  Location: [facility];  Service: General;  [place]: Left;   [place]   x[place]   CYSTO     pt denies   HERNIA REPAIR     x3   VAGINAL HYSTERECTOMY     total   WRIST GANGLION EXCISION Right     Current Medications: Outpatient Medications Prior to Visit  Medication Sig Dispense Refill   acyclovir (ZOVIRAX) 800 MG tablet Take 1 tablet (800 mg total) by mouth 3 (three) times daily for 2 days, then as needed. 30 tablet 0   ADDERALL XR 30 MG 24 hr capsule Take 60 mg by mouth daily.  0   amLODipine (NORVASC) 5 MG tablet Take 1 tablet by mouth daily. 90 tablet 3    amphetamine-dextroamphetamine (ADDERALL XR) 30 MG 24 hr capsule TAKE 2 CAPSULES BY MOUTH EVERY MORNING 60 capsule 0   amphetamine-dextroamphetamine (ADDERALL XR) 30 MG 24 hr capsule TAKE 2 CAPSULES BY MOUTH EVERY MORNING 60 capsule 0   amphetamine-dextroamphetamine (ADDERALL XR) 30 MG 24 hr capsule TAKE 2 CAPSULES BY MOUTH EVERY MORNING **CAN FILL 3.12.22 60 capsule 0   amphetamine-dextroamphetamine (ADDERALL XR) 30 MG 24 hr capsule TAKE 2 CAPSULES BY MOUTH EVERY MORNING **CAN FILL 07/26/20 60 capsule 0   amphetamine-dextroamphetamine (ADDERALL XR) 30 MG 24 hr capsule TAKE 2 CAPSULES BY MOUTH EVERY MORNING DO NOT FILL UNTIL 04/30/20 60 capsule 0   amphetamine-dextroamphetamine (ADDERALL XR) 30 MG 24 hr capsule TAKE 2 CAPSULES BY MOUTH EVERY MORNING DO NOT FILL UNTIL 03/31/20 60 capsule 0   amphetamine-dextroamphetamine (ADDERALL XR) 30 MG 24 hr capsule TAKE 2 CAPSULES BY MOUTH EVERY MORNING 60 capsule 0   amphetamine-dextroamphetamine (ADDERALL XR) 30 MG 24 hr capsule Take 2 capules by mouth once a day 60 capsule 0   amphetamine-dextroamphetamine (ADDERALL XR) 30 MG 24 hr capsule Take 2 capsules by mouth in the morning  **09/18/20** 60 capsule 0   amphetamine-dextroamphetamine (ADDERALL XR) 30 MG 24 hr capsule Take 2 capsules by mouth  in the morning 60 capsule 0   amphetamine-dextroamphetamine (ADDERALL XR) 30 MG 24 hr capsule Take 2 capsules by mouth every morning. 60 capsule 0   amphetamine-dextroamphetamine (ADDERALL XR) 30 MG 24 hr capsule Take 2 capsules by mouth every morning 60 capsule 0   amphetamine-dextroamphetamine (ADDERALL XR) 30 MG 24 hr capsule Take 2 capsules by mouth every morning (12/08/20) 60 capsule 0   amphetamine-dextroamphetamine (ADDERALL XR) 30 MG 24 hr capsule Take 2 capsules by mouth every morning (01/07/21) 60 capsule 0   amphetamine-dextroamphetamine (ADDERALL XR) 30 MG 24 hr capsule Take 2 capsules by mouth every morning. 60 capsule 0   amphetamine-dextroamphetamine  (ADDERALL XR) 30 MG 24 hr capsule Take 2 capsules by mouth every morning. 60 capsule 0   amphetamine-dextroamphetamine (ADDERALL XR) 30 MG 24 hr capsule Take 2 capsules by mouth every morning. May fill 04/04/21 60 capsule 0   amphetamine-dextroamphetamine (ADDERALL XR) 30 MG 24 hr capsule Take 2 capsules by mouth every morning 60 capsule 0   amphetamine-dextroamphetamine (ADDERALL XR) 30 MG 24 hr capsule Take 2 capsules by mouth every morning. (06/03/21) 60 capsule 0   amphetamine-dextroamphetamine (ADDERALL XR) 30 MG 24 hr capsule Take 2 capsules by mouth every morning 60 capsule 0   amphetamine-dextroamphetamine (ADDERALL XR) 30 MG 24 hr capsule Take 2 capsules by mouth every morning * Do not fill before 08/28/21 60 capsule 0   amphetamine-dextroamphetamine (ADDERALL XR) 30 MG 24 hr capsule Take 2 capsules by mouth every morning 60 capsule 0   amphetamine-dextroamphetamine (ADDERALL XR) 30 MG 24 hr capsule Take 2 capsules by mouth every morning (12/01/21) 60 capsule 0   [START ON 03/24/2022] amphetamine-dextroamphetamine (ADDERALL XR) 30 MG 24 hr capsule Take 2 capsules (60 mg total) by mouth every morning. 60 capsule 0   amphetamine-dextroamphetamine (ADDERALL XR) 30 MG 24 hr capsule Take 2 capsules (60 mg total) by mouth every morning. 60 capsule 0   [START ON 02/22/2022] amphetamine-dextroamphetamine (ADDERALL XR) 30 MG 24 hr capsule Take 2 capsules (60 mg total) by mouth every morning. 60 capsule 0   aspirin-acetaminophen-caffeine (EXCEDRIN MIGRAINE) 250-250-65 MG tablet Take 2 tablets by mouth 2 (two) times daily as needed for headache.     fluticasone (FLONASE) 50 MCG/ACT nasal spray Place 1 spray into both nostrils daily as needed for allergies or rhinitis.     MOUNJARO 10 MG/0.5ML Pen Inject 10 mg into the skin  every week 2 mL 0   MOUNJARO 15 MG/0.5ML Pen Inject 15 mg subcutaneously every week 2 mL 0   MOUNJARO 15 MG/0.5ML Pen Inject 15 mg into the skin once every week 2 mL 0   MOUNJARO 15  MG/0.5ML Pen Inject 15 mg under the skin every week 2 mL 0   MOUNJARO 15 MG/0.5ML Pen Inject 1 pen into the skin every week 2 mL 0   MOUNJARO 15 MG/0.5ML Pen Inject 1 pen (15 mg) into the skin once every week 2 mL 0   MOUNJARO 7.5 MG/0.5ML Pen Inject 1 pen (7.5 mg) into the skin every week 2 mL 0   potassium chloride (K-DUR) 10 MEQ tablet Take 2 tablets (20 mEq total) by mouth 2 (two) times daily. (Patient not taking: Reported on 09/25/2017) 20 tablet 0   predniSONE (DELTASONE) 20 MG tablet Take 2 Tablet(s) by mouth every day 10 tablet 0   rosuvastatin (CRESTOR) 20 MG tablet Take 1 tablet (20 mg total) by mouth daily. 90 tablet 3   tirzepatide (MOUNJARO) 2.5 MG/0.5ML Pen Inject 1  syringe (2.'5mg'$ ) subcutaneously once a week 2 mL 0   tirzepatide (MOUNJARO) 5 MG/0.5ML Pen Inject 1 syringe ('5mg'$ ) into the skin every week. 2 mL 0   traMADol (ULTRAM) 50 MG tablet Take 1 tablet (50 mg total) by mouth every 6 (six) hours as needed. (Patient not taking: Reported on 09/25/2017) 25 tablet 0   traMADol (ULTRAM) 50 MG tablet Take 1 tablet (50 mg total) by mouth every 6 (six) hours as needed. 25 tablet 0   WEGOVY 1.7 MG/0.75ML SOAJ Inject 1.7 mg under the skin once weekly for weight loss 3 mL 0   WEGOVY 2.4 MG/0.75ML SOAJ Inject 2.4 mg into the skin once a week for weight loss 3 mL 0   No facility-administered medications prior to visit.     Allergies:   Amoxicillin, Dilaudid [hydromorphone hcl], and Tylox [oxycodone-acetaminophen]   Social History   Socioeconomic History   Marital status: Married    Spouse name: Not on file   Number of children: Not on file   Years of education: Not on file   Highest education level: Not on file  Occupational History   Not on file  Tobacco Use   Smoking status: Every Day    Packs/day: 1.00    Years: 25.00    Total pack years: 25.00    Types: Cigarettes   Smokeless tobacco: Never  Vaping Use   Vaping Use: Never used  Substance and Sexual Activity   Alcohol use:  Yes    Comment: occ   Drug use: No   Sexual activity: Yes    Birth control/protection: Surgical    Comment: hyst  Other Topics Concern   Not on file  Social History Narrative   Not on file   Social Determinants of Health   Financial Resource Strain: Not on file  Food Insecurity: Not on file  Transportation Needs: Not on file  Physical Activity: Not on file  Stress: Not on file  Social Connections: Not on file     Family History:  The patient's ***family history includes Alzheimer's disease in her mother; CAD in an other family member; Cancer in her maternal grandmother and mother; Diabetes in her mother; Hypertension in her father.   Review of Systems:    Please see the history of present illness.     All other systems reviewed and are otherwise negative except as noted above.   Physical Exam:    VS:  There were no vitals taken for this visit.   General: Well developed, well nourished,female appearing in no acute distress. Head: Normocephalic, atraumatic. Neck: No carotid bruits. JVD not elevated.  Lungs: Respirations regular and unlabored, without wheezes or rales.  Heart: ***Regular rate and rhythm. No S3 or S4.  No murmur, no rubs, or gallops appreciated. Abdomen: Appears non-distended. No obvious abdominal masses. Msk:  Strength and tone appear normal for age. No obvious joint deformities or effusions. Extremities: No clubbing or cyanosis. No edema.  Distal pedal pulses are 2+ bilaterally. Neuro: Alert and oriented X 3. Moves all extremities spontaneously. No focal deficits noted. Psych:  Responds to questions appropriately with a normal affect. Skin: No rashes or lesions noted  Wt Readings from Last 3 Encounters:  11/14/21 146 lb 6.4 oz (66.4 kg)  11/06/21 154 lb (69.9 kg)  09/25/17 219 lb (99.3 kg)        Studies/Labs Reviewed:   EKG:  EKG is*** ordered today.  The ekg ordered today demonstrates ***  Recent Labs: 11/06/2021: BUN 12;  Creatinine, Ser  0.70; Hemoglobin 15.0; Platelets 317; Potassium 3.7; Sodium 141 11/14/2021: TSH 4.624   Lipid Panel    Component Value Date/Time   TRIG 78 11/14/2021 1029   HDL 57 11/14/2021 1029    Additional studies/ records that were reviewed today include:   CTA Chest: 10/2021 IMPRESSION: 1. Mild aortic atherosclerosis without evidence of aneurysm or dissection. 2. No acute process in the chest, abdomen, or pelvis.  Assessment:    No diagnosis found.   Plan:   In order of problems listed above:  1. HTN - ***  2. Aortic Atherosclerosis - ***  3. Tobacco Use - ***   Shared Decision Making/Informed Consent:   {Are you ordering a CV Procedure (e.g. stress test, cath, DCCV, TEE, etc)?   Press F2        :[id number]}    Medication Adjustments/Labs and Tests Ordered: Current medicines are reviewed at length with the patient today.  Concerns regarding medicines are outlined above.  Medication changes, Labs and Tests ordered today are listed in the Patient Instructions below. There are no Patient Instructions on file for this visit.   Signed, Erma Heritage, PA-C  02/21/2022 11:23 AM    [address] [place], [place] [address] Phone: [phone] Fax: [phone]

## 2022-02-22 ENCOUNTER — Other Ambulatory Visit (HOSPITAL_BASED_OUTPATIENT_CLINIC_OR_DEPARTMENT_OTHER): Payer: Self-pay

## 2022-02-22 ENCOUNTER — Encounter (HOSPITAL_BASED_OUTPATIENT_CLINIC_OR_DEPARTMENT_OTHER): Payer: Self-pay | Admitting: Pharmacist

## 2022-02-23 ENCOUNTER — Other Ambulatory Visit (HOSPITAL_BASED_OUTPATIENT_CLINIC_OR_DEPARTMENT_OTHER): Payer: Self-pay

## 2022-02-27 ENCOUNTER — Ambulatory Visit
Admission: RE | Admit: 2022-02-27 | Discharge: 2022-02-27 | Disposition: A | Discharge status: Home / Self Care | Payer: 59 | Source: Ambulatory Visit | Attending: Emergency Medicine | Admitting: Emergency Medicine

## 2022-02-27 ENCOUNTER — Other Ambulatory Visit (HOSPITAL_BASED_OUTPATIENT_CLINIC_OR_DEPARTMENT_OTHER): Payer: Self-pay

## 2022-02-27 VITALS — BP 159/95 | HR 81 | Temp 98.2°F | Resp 18

## 2022-02-27 DIAGNOSIS — H66001 Acute suppurative otitis media without spontaneous rupture of ear drum, right ear: Secondary | ICD-10-CM

## 2022-02-27 MED ORDER — CEFDINIR 300 MG PO CAPS
300.0000 mg | ORAL_CAPSULE | Freq: Two times a day (BID) | ORAL | 0 refills | Status: AC
  Filled 2022-02-27: qty 20, 10d supply, fill #0

## 2022-02-27 NOTE — ED Triage Notes (Signed)
Pt c/o right sided ear pain for about a week. The patient states she feels burning sensation in the ear.  Home interventions: tylenol

## 2022-02-27 NOTE — Discharge Instructions (Signed)
Please read below to learn more about the medications, dosages and frequencies that I recommend to help alleviate your symptoms and to get you feeling better soon:   Omnicef (cefdinir):  1 capsule twice daily for 10 days, you can take it with or without food.  This antibiotic can cause upset stomach, this will resolve once antibiotics are complete.  You are welcome to use a probiotic, eat yogurt, take Imodium while taking this medication.  Please avoid other systemic medications such as Maalox, Pepto-Bismol or milk of magnesia as they can interfere with your body's ability to absorb the antibiotics. Please follow-up within the next 7-10 days either with your primary care provider or urgent care if your symptoms do not resolve.          Thank you for visiting urgent care today.  We appreciate the opportunity to participate in your care.

## 2022-02-27 NOTE — ED Provider Notes (Signed)
[facility]    CSN: [id number] Arrival date & time: 02/27/22  1446    HISTORY   Chief Complaint  Patient presents with   Ear Fullness    Terrible ear ache - Entered by patient   HPI [name] is a pleasant, 46 y.o. female who presents to urgent care today. Patient complains of pain in her right ear which she describes as a burning sensation, states this has been going on for about a week.  Patient denies hearing loss in her right ear.  Patient states she is tried Tylenol without meaningful relief of her symptoms.  Patient has a blood pressure of 159/95 on arrival today, vital signs are otherwise normal.  Patient states she used to get ear infections since she was a little girl but has not had an ear infection in 30 years.  Patient states she is a current every day smoker.  Patient denies sinus pressure, sinus pain, nasal congestion.  Patient states her throat has been a little bit sore on the left side.  Patient denies pain with moving head side-to-side.   The history is provided by the patient.   Past Medical History:  Diagnosis Date   ADD (attention deficit disorder)    Breast disorder    fibroadenoma   PONV (postoperative nausea and vomiting)    Patient Active Problem List   Diagnosis Date Noted   Chest pain 11/06/2021   Breast lump on left side at 2 o'clock position    Abnormal mammogram    Papilloma of left breast 08/06/2017   Well woman exam with routine gynecological exam 08/06/2017   Screening for colorectal cancer 08/06/2017   Mass of upper outer quadrant of left breast 07/02/2017   Nipple discharge 07/02/2017   Cervical spondylosis with radiculopathy 07/03/2014   ADD (attention deficit disorder) 07/10/2012   Left breast mass 07/10/2012   Past Surgical History:  Procedure Laterality Date   ABDOMINAL HYSTERECTOMY     BREAST BIOPSY Left 08/21/2017   Procedure: BREAST BIOPSY X 2;  Surgeon: Aviva Signs, MD;  Location: [facility];  Service: General;   Laterality: Left;   [place] and 2008   x2   CYSTO     pt denies   HERNIA REPAIR     x3   VAGINAL HYSTERECTOMY     total   WRIST GANGLION EXCISION Right    OB History     Gravida  1   Para  1   Term  1   Preterm      AB      Living  1      SAB      IAB      Ectopic      Multiple      Live Births  1          Home Medications    Prior to Admission medications   Medication Sig Start Date End Date Taking? Authorizing Provider  acyclovir (ZOVIRAX) 800 MG tablet Take 1 tablet (800 mg total) by mouth 3 (three) times daily for 2 days, then as needed. 02/13/22     amLODipine (NORVASC) 5 MG tablet Take 1 tablet by mouth daily. 11/14/21 11/09/22  Fay Records, MD  amphetamine-dextroamphetamine (ADDERALL XR) 30 MG 24 hr capsule Take 2 capsules (60 mg total) by mouth every morning. 02/22/22     aspirin-acetaminophen-caffeine (EXCEDRIN MIGRAINE) 250-250-65 MG tablet Take 2 tablets by mouth 2 (two) times daily as needed  for headache.    [provider]  fluticasone (FLONASE) 50 MCG/ACT nasal spray Place 1 spray into both nostrils daily as needed for allergies or rhinitis.    [provider]  rosuvastatin (CRESTOR) 20 MG tablet Take 1 tablet (20 mg total) by mouth daily. 11/17/21   Fay Records, MD  WEGOVY 2.4 MG/0.75ML SOAJ Inject 2.4 mg into the skin once a week for weight loss 02/02/22       Family History Family History  Problem Relation Age of Onset   Cancer Mother        breast;age 62   Diabetes Mother    Alzheimer's disease Mother    Hypertension Father    CAD Other    Cancer Maternal Grandmother        breast   Social History Social History   Tobacco Use   Smoking status: Every Day    Packs/day: 1.00    Years: 25.00    Total pack years: 25.00    Types: Cigarettes   Smokeless tobacco: Never  Vaping Use   Vaping Use: Never used  Substance Use Topics   Alcohol use: Yes    Comment: occ   Drug use: No   Allergies    Amoxicillin, Dilaudid [hydromorphone hcl], and Oxycodone  Review of Systems Review of Systems Pertinent findings revealed after performing a 14 point review of systems has been noted in the history of present illness.  Physical Exam Triage Vital Signs ED Triage Vitals  Enc Vitals Group     BP 02/11/21 0827 (!) 147/82     Pulse Rate 02/11/21 0827 72     Resp 02/11/21 0827 18     Temp 02/11/21 0827 98.3 F (36.8 C)     Temp Source 02/11/21 0827 Oral     SpO2 02/11/21 0827 98 %     Weight --      Height --      Head Circumference --      Peak Flow --      Pain Score 02/11/21 0826 5     Pain Loc --      Pain Edu? --      Excl. in [place]? --   No data found.  Updated Vital Signs BP (!) 159/95 (BP Location: Left Arm)   Pulse 81   Temp 98.2 F (36.8 C) (Oral)   Resp 18   SpO2 97%   Physical Exam Vitals and nursing note reviewed.  Constitutional:      General: She is not in acute distress.    Appearance: Normal appearance. She is not ill-appearing.  HENT:     Head: Normocephalic and atraumatic.     Salivary Glands: Right salivary gland is not diffusely enlarged or tender. Left salivary gland is tender. Left salivary gland is not diffusely enlarged.     Right Ear: Hearing, ear canal and external ear normal. No drainage. A middle ear effusion is present. There is no impacted cerumen. Tympanic membrane is injected, erythematous and bulging. Tympanic membrane is not retracted.     Left Ear: Hearing, tympanic membrane, ear canal and external ear normal. No drainage.  No middle ear effusion. There is no impacted cerumen. Tympanic membrane is not erythematous or bulging.     Nose: Nose normal. No nasal deformity, septal deviation, mucosal edema, congestion or rhinorrhea.     Right Turbinates: Not enlarged, swollen or pale.     Left Turbinates: Not enlarged, swollen or pale.     Right  Sinus: No maxillary sinus tenderness or frontal sinus tenderness.     Left Sinus: No maxillary sinus  tenderness or frontal sinus tenderness.     Mouth/Throat:     Lips: Pink. No lesions.     Mouth: Mucous membranes are moist. No oral lesions.     Dentition: Normal dentition.     Pharynx: Oropharynx is clear. Uvula midline. No pharyngeal swelling, oropharyngeal exudate, posterior oropharyngeal erythema or uvula swelling.     Tonsils: No tonsillar exudate or tonsillar abscesses. 0 on the right. 0 on the left.  Eyes:     General: Lids are normal.        Right eye: No discharge.        Left eye: No discharge.     Extraocular Movements: Extraocular movements intact.     Conjunctiva/sclera: Conjunctivae normal.     Right eye: Right conjunctiva is not injected.     Left eye: Left conjunctiva is not injected.  Neck:     Trachea: Trachea and phonation normal.  Cardiovascular:     Rate and Rhythm: Normal rate and regular rhythm.     Pulses: Normal pulses.     Heart sounds: Normal heart sounds. No murmur heard.    No friction rub. No gallop.  Pulmonary:     Effort: Pulmonary effort is normal. No accessory muscle usage, prolonged expiration or respiratory distress.     Breath sounds: Normal breath sounds. No stridor, decreased air movement or transmitted upper airway sounds. No decreased breath sounds, wheezing, rhonchi or rales.  Chest:     Chest wall: No tenderness.  Musculoskeletal:        General: Normal range of motion.     Cervical back: Normal range of motion and neck supple. Pain with movement present. Normal range of motion.  Lymphadenopathy:     Cervical: No cervical adenopathy.     Right cervical: No superficial, deep or posterior cervical adenopathy.    Left cervical: No superficial, deep or posterior cervical adenopathy.  Skin:    General: Skin is warm and dry.     Findings: No erythema or rash.  Neurological:     General: No focal deficit present.     Mental Status: She is alert and oriented to person, place, and time.  Psychiatric:        Mood and Affect: Mood normal.         Behavior: Behavior normal.     Visual Acuity Right Eye Distance:   Left Eye Distance:   Bilateral Distance:    Right Eye Near:   Left Eye Near:    Bilateral Near:     UC Couse / Diagnostics / Procedures:     Radiology No results found.  Procedures Procedures (including critical care time) EKG  Pending results:  Labs Reviewed - No data to display  Medications Ordered in UC: Medications - No data to display  UC Diagnoses / Final Clinical Impressions(s)   I have reviewed the triage vital signs and the nursing notes.  Pertinent labs & imaging results that were available during my care of the patient were reviewed by me and considered in my medical decision making (see chart for details).    Final diagnoses:  Acute suppurative otitis media of right ear without spontaneous rupture of tympanic membrane, recurrence not specified   Patient provided with a 10-day course of cefdinir for treatment of bacterial infection in right inner ear.  Conservative care recommended.  Return precautions advised.  ED  Prescriptions     Medication Sig Dispense Auth. Provider   cefdinir (OMNICEF) 300 MG capsule Take 1 capsule (300 mg total) by mouth 2 (two) times daily for 10 days. 20 capsule Lynden Oxford Scales, PA-C      PDMP not reviewed this encounter.  Pending results:  Labs Reviewed - No data to display  Discharge Instructions:   Discharge Instructions      Please read below to learn more about the medications, dosages and frequencies that I recommend to help alleviate your symptoms and to get you feeling better soon:   Omnicef (cefdinir):  1 capsule twice daily for 10 days, you can take it with or without food.  This antibiotic can cause upset stomach, this will resolve once antibiotics are complete.  You are welcome to use a probiotic, eat yogurt, take Imodium while taking this medication.  Please avoid other systemic medications such as Maalox, Pepto-Bismol or milk of  magnesia as they can interfere with your body's ability to absorb the antibiotics. Please follow-up within the next 7-10 days either with your primary care provider or urgent care if your symptoms do not resolve.          Thank you for visiting urgent care today.  We appreciate the opportunity to participate in your care.         Disposition Upon Discharge:  Condition: stable for discharge home  Patient presented with an acute illness with associated systemic symptoms and significant discomfort requiring urgent management. In my opinion, this is a condition that a prudent lay person (someone who possesses an average knowledge of health and medicine) may potentially expect to result in complications if not addressed urgently such as respiratory distress, impairment of bodily function or dysfunction of bodily organs.   Routine symptom specific, illness specific and/or disease specific instructions were discussed with the patient and/or caregiver at length.   As such, the patient has been evaluated and assessed, work-up was performed and treatment was provided in alignment with urgent care protocols and evidence based medicine.  Patient/parent/caregiver has been advised that the patient may require follow up for further testing and treatment if the symptoms continue in spite of treatment, as clinically indicated and appropriate.  Patient/parent/caregiver has been advised to return to the [facility] or PCP if no better; to PCP or the Emergency Department if new signs and symptoms develop, or if the current signs or symptoms continue to change or worsen for further workup, evaluation and treatment as clinically indicated and appropriate  The patient will follow up with their current PCP if and as advised. If the patient does not currently have a PCP we will assist them in obtaining one.   The patient may need specialty follow up if the symptoms continue, in spite of conservative treatment and  management, for further workup, evaluation, consultation and treatment as clinically indicated and appropriate.   Patient/parent/caregiver verbalized understanding and agreement of plan as discussed.  All questions were addressed during visit.  Please see discharge instructions below for further details of plan.  This office note has been dictated using Museum/gallery curator.  Unfortunately, this method of dictation can sometimes lead to typographical or grammatical errors.  I apologize for your inconvenience in advance if this occurs.  Please do not hesitate to reach out to me if clarification is needed.      Lynden Oxford Scales, PA-C 03/02/22 1144

## 2022-03-10 ENCOUNTER — Other Ambulatory Visit (HOSPITAL_BASED_OUTPATIENT_CLINIC_OR_DEPARTMENT_OTHER): Payer: Self-pay

## 2022-03-10 MED ORDER — WEGOVY 2.4 MG/0.75ML ~~LOC~~ SOAJ
SUBCUTANEOUS | 0 refills | Status: DC
  Filled 2022-03-10: qty 3, 28d supply, fill #0

## 2022-03-21 ENCOUNTER — Other Ambulatory Visit (HOSPITAL_BASED_OUTPATIENT_CLINIC_OR_DEPARTMENT_OTHER): Payer: Self-pay

## 2022-03-21 ENCOUNTER — Ambulatory Visit
Admission: RE | Admit: 2022-03-21 | Discharge: 2022-03-21 | Disposition: A | Discharge status: Home / Self Care | Payer: 59 | Source: Ambulatory Visit | Attending: Urgent Care | Admitting: Urgent Care

## 2022-03-21 VITALS — BP 143/87 | HR 77 | Temp 98.1°F | Resp 17

## 2022-03-21 DIAGNOSIS — H6991 Unspecified Eustachian tube disorder, right ear: Secondary | ICD-10-CM | POA: Diagnosis not present

## 2022-03-21 DIAGNOSIS — F17200 Nicotine dependence, unspecified, uncomplicated: Secondary | ICD-10-CM | POA: Diagnosis not present

## 2022-03-21 MED ORDER — PSEUDOEPHEDRINE HCL 30 MG PO TABS
30.0000 mg | ORAL_TABLET | Freq: Three times a day (TID) | ORAL | 0 refills | Status: AC | PRN
  Filled 2022-03-21: qty 24, 8d supply, fill #0

## 2022-03-21 MED ORDER — LEVOCETIRIZINE DIHYDROCHLORIDE 5 MG PO TABS
5.0000 mg | ORAL_TABLET | Freq: Every evening | ORAL | 0 refills | Status: AC
  Filled 2022-03-21: qty 90, 90d supply, fill #0

## 2022-03-21 MED ORDER — PREDNISONE 50 MG PO TABS
50.0000 mg | ORAL_TABLET | Freq: Every day | ORAL | 0 refills | Status: AC
  Filled 2022-03-21: qty 5, 5d supply, fill #0

## 2022-03-21 NOTE — ED Provider Notes (Signed)
[facility]  Note:  This document was prepared using Systems analyst and may include unintentional dictation errors.  MRN: [id number] DOB: Sep 03, 1975  Subjective:   [name] is a 46 y.o. female presenting for persistent right ear pain, ear fullness. Smokes 1ppd.  Patient was seen in mid November and finished a 10-day course of Omnicef for right otitis media.  It did help some but very little and did not resolve her symptoms.  No chest pain, shortness of breath or wheezing.  No sinus pain, facial pain.  No current facility-administered medications for this encounter.  Current Outpatient Medications:    acyclovir (ZOVIRAX) 800 MG tablet, Take 1 tablet (800 mg total) by mouth 3 (three) times daily for 2 days, then as needed., Disp: 30 tablet, Rfl: 0   amLODipine (NORVASC) 5 MG tablet, Take 1 tablet by mouth daily., Disp: 90 tablet, Rfl: 3   amphetamine-dextroamphetamine (ADDERALL XR) 30 MG 24 hr capsule, Take 2 capsules (60 mg total) by mouth every morning., Disp: 60 capsule, Rfl: 0   aspirin-acetaminophen-caffeine (EXCEDRIN MIGRAINE) 250-250-65 MG tablet, Take 2 tablets by mouth 2 (two) times daily as needed for headache., Disp: , Rfl:    fluticasone (FLONASE) 50 MCG/ACT nasal spray, Place 1 spray into both nostrils daily as needed for allergies or rhinitis., Disp: , Rfl:    rosuvastatin (CRESTOR) 20 MG tablet, Take 1 tablet (20 mg total) by mouth daily., Disp: 90 tablet, Rfl: 3   WEGOVY 2.4 MG/0.75ML SOAJ, Inject 2.4 mg subcutaneously every week for weight loss, Disp: 3 mL, Rfl: 0   Allergies  Allergen Reactions   Amoxicillin Hives   Dilaudid [Hydromorphone Hcl] Itching   Oxycodone Itching    Past Medical History:  Diagnosis Date   ADD (attention deficit disorder)    Breast disorder    fibroadenoma   PONV (postoperative nausea and vomiting)      Past Surgical History:  Procedure Laterality Date   ABDOMINAL HYSTERECTOMY      BREAST BIOPSY Left 08/21/2017   Procedure: BREAST BIOPSY X 2;  Surgeon: Aviva Signs, MD;  Location: [facility];  Service: General;  Laterality: Left;   [place] and 2008   x2   CYSTO     pt denies   HERNIA REPAIR     x3   VAGINAL HYSTERECTOMY     total   WRIST GANGLION EXCISION Right     Family History  Problem Relation Age of Onset   Cancer Mother        breast;age 26   Diabetes Mother    Alzheimer's disease Mother    Hypertension Father    CAD Other    Cancer Maternal Grandmother        breast    Social History   Tobacco Use   Smoking status: Every Day    Packs/day: 1.00    Years: 25.00    Total pack years: 25.00    Types: Cigarettes   Smokeless tobacco: Never  Vaping Use   Vaping Use: Never used  Substance Use Topics   Alcohol use: Yes    Comment: occ   Drug use: No    ROS   Objective:   Vitals: BP (!) 143/87 (BP Location: Left Arm)   Pulse 77   Temp 98.1 F (36.7 C) (Oral)   Resp 17   SpO2 98%   Physical Exam Constitutional:      General: She is not in acute  distress.    Appearance: Normal appearance. She is well-developed and normal weight. She is not ill-appearing, toxic-appearing or diaphoretic.  HENT:     Head: Normocephalic and atraumatic.     Right Ear: Tympanic membrane, ear canal and external ear normal. No drainage or tenderness. No middle ear effusion. There is no impacted cerumen. Tympanic membrane is not erythematous or bulging.     Left Ear: Tympanic membrane, ear canal and external ear normal. No drainage or tenderness.  No middle ear effusion. There is no impacted cerumen. Tympanic membrane is not erythematous or bulging.     Nose: Congestion present. No rhinorrhea.     Comments: Nasal mucosa boggy and edematous.    Mouth/Throat:     Mouth: Mucous membranes are moist. No oral lesions.     Pharynx: No pharyngeal swelling, oropharyngeal exudate, posterior oropharyngeal erythema or uvula swelling.     Tonsils: No  tonsillar exudate or tonsillar abscesses.     Comments: Thick streaks of postnasal drainage overlying pharynx. Eyes:     General: No scleral icterus.       Right eye: No discharge.        Left eye: No discharge.     Extraocular Movements: Extraocular movements intact.     Right eye: Normal extraocular motion.     Left eye: Normal extraocular motion.     Conjunctiva/sclera: Conjunctivae normal.  Cardiovascular:     Rate and Rhythm: Normal rate.  Pulmonary:     Effort: Pulmonary effort is normal.  Musculoskeletal:     Cervical back: Normal range of motion and neck supple.  Lymphadenopathy:     Cervical: No cervical adenopathy.  Skin:    General: Skin is warm and dry.  Neurological:     General: No focal deficit present.     Mental Status: She is alert and oriented to person, place, and time.  Psychiatric:        Mood and Affect: Mood normal.        Behavior: Behavior normal.     Assessment and Plan :   PDMP not reviewed this encounter.  1. Eustachian tube dysfunction, right   2. Smoking     Recommended patient hold Flonase for now. Unremarkable ENT exam.  Will use aggressive management management for what I suspect is eustachian tube dysfunction persistent despite an antibiotic and her Flonase, therefore will use prednisone.  Recommended restarting Flonase thereafter, start Xyzal especially as she continues to smoke, use Sudafed as needed following the prednisone course.  Will defer repeat antibiotics as she just finished around.  Counseled patient on potential for adverse effects with medications prescribed/recommended today, ER and return-to-clinic precautions discussed, patient verbalized understanding.    Jaynee Eagles, PA-C 03/21/22 1236

## 2022-03-21 NOTE — Discharge Instructions (Addendum)
Start taking levocetirizine daily. Hold Flonase while you are on prednisone. Once you finish the prednisone you can restart Flonase. Add in pseudoephedrine as needed thereafter as well.

## 2022-03-22 ENCOUNTER — Other Ambulatory Visit (HOSPITAL_BASED_OUTPATIENT_CLINIC_OR_DEPARTMENT_OTHER): Payer: Self-pay

## 2022-03-22 ENCOUNTER — Encounter (HOSPITAL_BASED_OUTPATIENT_CLINIC_OR_DEPARTMENT_OTHER): Payer: Self-pay | Admitting: Pharmacist

## 2022-03-22 MED ORDER — AMPHETAMINE-DEXTROAMPHET ER 30 MG PO CP24
60.0000 mg | ORAL_CAPSULE | Freq: Every morning | ORAL | 0 refills | Status: AC
  Filled 2022-03-23 – 2022-03-25 (×2): qty 60, 30d supply, fill #0

## 2022-03-23 ENCOUNTER — Encounter (HOSPITAL_BASED_OUTPATIENT_CLINIC_OR_DEPARTMENT_OTHER): Payer: Self-pay | Admitting: Pharmacist

## 2022-03-23 ENCOUNTER — Other Ambulatory Visit (HOSPITAL_BASED_OUTPATIENT_CLINIC_OR_DEPARTMENT_OTHER): Payer: Self-pay

## 2022-03-25 ENCOUNTER — Other Ambulatory Visit (HOSPITAL_BASED_OUTPATIENT_CLINIC_OR_DEPARTMENT_OTHER): Payer: Self-pay

## 2022-04-12 ENCOUNTER — Other Ambulatory Visit (HOSPITAL_BASED_OUTPATIENT_CLINIC_OR_DEPARTMENT_OTHER): Payer: Self-pay

## 2022-04-12 MED ORDER — WEGOVY 2.4 MG/0.75ML ~~LOC~~ SOAJ
2.4000 mg | SUBCUTANEOUS | 0 refills | Status: DC
  Filled 2022-04-12 – 2022-05-23 (×5): qty 3, 28d supply, fill #0

## 2022-04-14 ENCOUNTER — Other Ambulatory Visit (HOSPITAL_BASED_OUTPATIENT_CLINIC_OR_DEPARTMENT_OTHER): Payer: Self-pay

## 2022-04-21 ENCOUNTER — Other Ambulatory Visit (HOSPITAL_BASED_OUTPATIENT_CLINIC_OR_DEPARTMENT_OTHER): Payer: Self-pay

## 2022-04-21 DIAGNOSIS — I10 Essential (primary) hypertension: Secondary | ICD-10-CM | POA: Diagnosis not present

## 2022-04-21 DIAGNOSIS — F988 Other specified behavioral and emotional disorders with onset usually occurring in childhood and adolescence: Secondary | ICD-10-CM | POA: Diagnosis not present

## 2022-04-21 MED ORDER — AMPHETAMINE-DEXTROAMPHET ER 30 MG PO CP24
60.0000 mg | ORAL_CAPSULE | Freq: Every morning | ORAL | 0 refills | Status: AC
  Filled 2022-06-20 – 2022-06-23 (×3): qty 60, 30d supply, fill #0

## 2022-04-21 MED ORDER — AMPHETAMINE-DEXTROAMPHET ER 30 MG PO CP24
60.0000 mg | ORAL_CAPSULE | Freq: Every morning | ORAL | 0 refills | Status: AC
  Filled 2022-04-21 – 2022-04-24 (×4): qty 60, 30d supply, fill #0

## 2022-04-21 MED ORDER — AMPHETAMINE-DEXTROAMPHET ER 30 MG PO CP24
60.0000 mg | ORAL_CAPSULE | Freq: Every morning | ORAL | 0 refills | Status: AC
  Filled 2022-05-23 – 2022-05-24 (×2): qty 60, 30d supply, fill #0

## 2022-04-22 ENCOUNTER — Other Ambulatory Visit (HOSPITAL_BASED_OUTPATIENT_CLINIC_OR_DEPARTMENT_OTHER): Payer: Self-pay

## 2022-04-22 ENCOUNTER — Other Ambulatory Visit: Payer: Self-pay

## 2022-04-24 ENCOUNTER — Other Ambulatory Visit (HOSPITAL_BASED_OUTPATIENT_CLINIC_OR_DEPARTMENT_OTHER): Payer: Self-pay

## 2022-04-24 ENCOUNTER — Other Ambulatory Visit: Payer: Self-pay

## 2022-04-26 ENCOUNTER — Other Ambulatory Visit (HOSPITAL_BASED_OUTPATIENT_CLINIC_OR_DEPARTMENT_OTHER): Payer: Self-pay

## 2022-05-03 ENCOUNTER — Other Ambulatory Visit (HOSPITAL_BASED_OUTPATIENT_CLINIC_OR_DEPARTMENT_OTHER): Payer: Self-pay

## 2022-05-15 ENCOUNTER — Other Ambulatory Visit (HOSPITAL_BASED_OUTPATIENT_CLINIC_OR_DEPARTMENT_OTHER): Payer: Self-pay

## 2022-05-23 ENCOUNTER — Other Ambulatory Visit (HOSPITAL_BASED_OUTPATIENT_CLINIC_OR_DEPARTMENT_OTHER): Payer: Self-pay

## 2022-05-23 ENCOUNTER — Encounter (HOSPITAL_BASED_OUTPATIENT_CLINIC_OR_DEPARTMENT_OTHER): Payer: Self-pay | Admitting: Pharmacist

## 2022-05-24 ENCOUNTER — Other Ambulatory Visit (HOSPITAL_BASED_OUTPATIENT_CLINIC_OR_DEPARTMENT_OTHER): Payer: Self-pay

## 2022-05-28 ENCOUNTER — Other Ambulatory Visit (HOSPITAL_BASED_OUTPATIENT_CLINIC_OR_DEPARTMENT_OTHER): Payer: Self-pay

## 2022-05-29 ENCOUNTER — Other Ambulatory Visit (HOSPITAL_BASED_OUTPATIENT_CLINIC_OR_DEPARTMENT_OTHER): Payer: Self-pay

## 2022-05-29 MED ORDER — ZEPBOUND 5 MG/0.5ML ~~LOC~~ SOAJ
SUBCUTANEOUS | 0 refills | Status: AC
  Filled 2022-05-29: qty 2, 28d supply, fill #0

## 2022-05-30 ENCOUNTER — Other Ambulatory Visit (HOSPITAL_BASED_OUTPATIENT_CLINIC_OR_DEPARTMENT_OTHER): Payer: Self-pay

## 2022-05-31 ENCOUNTER — Other Ambulatory Visit (HOSPITAL_BASED_OUTPATIENT_CLINIC_OR_DEPARTMENT_OTHER): Payer: Self-pay

## 2022-06-16 ENCOUNTER — Other Ambulatory Visit (HOSPITAL_BASED_OUTPATIENT_CLINIC_OR_DEPARTMENT_OTHER): Payer: Self-pay

## 2022-06-16 MED ORDER — ZEPBOUND 7.5 MG/0.5ML ~~LOC~~ SOAJ
7.5000 mg | SUBCUTANEOUS | 0 refills | Status: AC
  Filled 2022-06-16 – 2022-06-26 (×2): qty 2, 28d supply, fill #0

## 2022-06-20 ENCOUNTER — Other Ambulatory Visit (HOSPITAL_BASED_OUTPATIENT_CLINIC_OR_DEPARTMENT_OTHER): Payer: Self-pay

## 2022-06-20 ENCOUNTER — Encounter (HOSPITAL_BASED_OUTPATIENT_CLINIC_OR_DEPARTMENT_OTHER): Payer: Self-pay | Admitting: Pharmacist

## 2022-06-20 ENCOUNTER — Other Ambulatory Visit: Payer: Self-pay

## 2022-06-21 ENCOUNTER — Other Ambulatory Visit (HOSPITAL_BASED_OUTPATIENT_CLINIC_OR_DEPARTMENT_OTHER): Payer: Self-pay

## 2022-06-23 ENCOUNTER — Other Ambulatory Visit (HOSPITAL_BASED_OUTPATIENT_CLINIC_OR_DEPARTMENT_OTHER): Payer: Self-pay

## 2022-06-26 ENCOUNTER — Other Ambulatory Visit (HOSPITAL_BASED_OUTPATIENT_CLINIC_OR_DEPARTMENT_OTHER): Payer: Self-pay

## 2022-07-17 ENCOUNTER — Other Ambulatory Visit (HOSPITAL_BASED_OUTPATIENT_CLINIC_OR_DEPARTMENT_OTHER): Payer: Self-pay

## 2022-07-17 MED ORDER — AMPHETAMINE-DEXTROAMPHET ER 30 MG PO CP24
60.0000 mg | ORAL_CAPSULE | Freq: Every morning | ORAL | 0 refills | Status: AC
  Filled 2022-07-19 – 2022-07-24 (×4): qty 60, 30d supply, fill #0

## 2022-07-19 ENCOUNTER — Other Ambulatory Visit (HOSPITAL_BASED_OUTPATIENT_CLINIC_OR_DEPARTMENT_OTHER): Payer: Self-pay

## 2022-07-20 ENCOUNTER — Other Ambulatory Visit (HOSPITAL_BASED_OUTPATIENT_CLINIC_OR_DEPARTMENT_OTHER): Payer: Self-pay

## 2022-07-20 MED ORDER — ZEPBOUND 10 MG/0.5ML ~~LOC~~ SOAJ
10.0000 mg | SUBCUTANEOUS | 0 refills | Status: DC
  Filled 2022-07-20: qty 2, 28d supply, fill #0

## 2022-07-21 ENCOUNTER — Other Ambulatory Visit (HOSPITAL_BASED_OUTPATIENT_CLINIC_OR_DEPARTMENT_OTHER): Payer: Self-pay

## 2022-07-23 ENCOUNTER — Other Ambulatory Visit (HOSPITAL_BASED_OUTPATIENT_CLINIC_OR_DEPARTMENT_OTHER): Payer: Self-pay

## 2022-07-24 ENCOUNTER — Other Ambulatory Visit (HOSPITAL_BASED_OUTPATIENT_CLINIC_OR_DEPARTMENT_OTHER): Payer: Self-pay

## 2022-07-25 ENCOUNTER — Other Ambulatory Visit (HOSPITAL_BASED_OUTPATIENT_CLINIC_OR_DEPARTMENT_OTHER): Payer: Self-pay

## 2022-07-25 MED ORDER — ACYCLOVIR 800 MG PO TABS
800.0000 mg | ORAL_TABLET | Freq: Three times a day (TID) | ORAL | 2 refills | Status: DC
  Filled 2022-07-25: qty 30, 10d supply, fill #0
  Filled 2022-10-02: qty 30, 10d supply, fill #1
  Filled 2022-11-13: qty 30, 10d supply, fill #2

## 2022-07-26 ENCOUNTER — Other Ambulatory Visit (HOSPITAL_BASED_OUTPATIENT_CLINIC_OR_DEPARTMENT_OTHER): Payer: Self-pay

## 2022-07-27 ENCOUNTER — Other Ambulatory Visit (HOSPITAL_BASED_OUTPATIENT_CLINIC_OR_DEPARTMENT_OTHER): Payer: Self-pay

## 2022-08-01 ENCOUNTER — Other Ambulatory Visit (HOSPITAL_BASED_OUTPATIENT_CLINIC_OR_DEPARTMENT_OTHER): Payer: Self-pay

## 2022-08-08 ENCOUNTER — Other Ambulatory Visit (HOSPITAL_BASED_OUTPATIENT_CLINIC_OR_DEPARTMENT_OTHER): Payer: Self-pay

## 2022-08-08 DIAGNOSIS — I10 Essential (primary) hypertension: Secondary | ICD-10-CM | POA: Diagnosis not present

## 2022-08-08 DIAGNOSIS — F988 Other specified behavioral and emotional disorders with onset usually occurring in childhood and adolescence: Secondary | ICD-10-CM | POA: Diagnosis not present

## 2022-08-08 MED ORDER — AMPHETAMINE-DEXTROAMPHET ER 30 MG PO CP24
60.0000 mg | ORAL_CAPSULE | Freq: Every morning | ORAL | 0 refills | Status: AC
  Filled 2022-08-21: qty 60, 30d supply, fill #0

## 2022-08-08 MED ORDER — AMPHETAMINE-DEXTROAMPHET ER 30 MG PO CP24
60.0000 mg | ORAL_CAPSULE | Freq: Every morning | ORAL | 0 refills | Status: AC
  Filled 2022-10-16 – 2022-10-18 (×5): qty 60, 30d supply, fill #0

## 2022-08-08 MED ORDER — AMPHETAMINE-DEXTROAMPHET ER 30 MG PO CP24
60.0000 mg | ORAL_CAPSULE | Freq: Every morning | ORAL | 0 refills | Status: DC
  Filled 2022-09-19: qty 60, 30d supply, fill #0

## 2022-08-22 ENCOUNTER — Other Ambulatory Visit (HOSPITAL_BASED_OUTPATIENT_CLINIC_OR_DEPARTMENT_OTHER): Payer: Self-pay

## 2022-08-22 ENCOUNTER — Other Ambulatory Visit: Payer: Self-pay

## 2022-08-23 ENCOUNTER — Other Ambulatory Visit (HOSPITAL_BASED_OUTPATIENT_CLINIC_OR_DEPARTMENT_OTHER): Payer: Self-pay

## 2022-08-25 ENCOUNTER — Other Ambulatory Visit (HOSPITAL_BASED_OUTPATIENT_CLINIC_OR_DEPARTMENT_OTHER): Payer: Self-pay

## 2022-08-25 MED ORDER — ZEPBOUND 12.5 MG/0.5ML ~~LOC~~ SOAJ
12.5000 mg | SUBCUTANEOUS | 0 refills | Status: AC
  Filled 2022-08-25: qty 2, 28d supply, fill #0

## 2022-09-02 ENCOUNTER — Encounter (HOSPITAL_BASED_OUTPATIENT_CLINIC_OR_DEPARTMENT_OTHER): Payer: Self-pay

## 2022-09-02 ENCOUNTER — Other Ambulatory Visit (HOSPITAL_BASED_OUTPATIENT_CLINIC_OR_DEPARTMENT_OTHER): Payer: Self-pay

## 2022-09-19 ENCOUNTER — Other Ambulatory Visit: Payer: Self-pay

## 2022-09-19 ENCOUNTER — Other Ambulatory Visit (HOSPITAL_BASED_OUTPATIENT_CLINIC_OR_DEPARTMENT_OTHER): Payer: Self-pay

## 2022-10-10 ENCOUNTER — Other Ambulatory Visit (HOSPITAL_BASED_OUTPATIENT_CLINIC_OR_DEPARTMENT_OTHER): Payer: Self-pay

## 2022-10-10 MED ORDER — ZEPBOUND 10 MG/0.5ML ~~LOC~~ SOAJ
10.0000 mg | SUBCUTANEOUS | 0 refills | Status: DC
  Filled 2022-10-10: qty 2, 28d supply, fill #0

## 2022-10-16 ENCOUNTER — Other Ambulatory Visit (HOSPITAL_BASED_OUTPATIENT_CLINIC_OR_DEPARTMENT_OTHER): Payer: Self-pay

## 2022-10-17 ENCOUNTER — Other Ambulatory Visit (HOSPITAL_BASED_OUTPATIENT_CLINIC_OR_DEPARTMENT_OTHER): Payer: Self-pay

## 2022-10-17 ENCOUNTER — Other Ambulatory Visit: Payer: Self-pay

## 2022-10-18 ENCOUNTER — Other Ambulatory Visit (HOSPITAL_BASED_OUTPATIENT_CLINIC_OR_DEPARTMENT_OTHER): Payer: Self-pay

## 2022-10-18 ENCOUNTER — Other Ambulatory Visit: Payer: Self-pay

## 2022-10-20 ENCOUNTER — Other Ambulatory Visit: Payer: Self-pay

## 2022-11-13 ENCOUNTER — Other Ambulatory Visit (HOSPITAL_BASED_OUTPATIENT_CLINIC_OR_DEPARTMENT_OTHER): Payer: Self-pay

## 2022-11-13 MED ORDER — AMPHETAMINE-DEXTROAMPHET ER 30 MG PO CP24
60.0000 mg | ORAL_CAPSULE | Freq: Every morning | ORAL | 0 refills | Status: AC
  Filled 2022-11-13: qty 60, 30d supply, fill #0
  Filled 2022-11-13: qty 60, 60d supply, fill #0
  Filled 2022-11-14 – 2022-11-17 (×5): qty 60, 30d supply, fill #0

## 2022-11-14 ENCOUNTER — Other Ambulatory Visit: Payer: Self-pay

## 2022-11-14 ENCOUNTER — Other Ambulatory Visit (HOSPITAL_BASED_OUTPATIENT_CLINIC_OR_DEPARTMENT_OTHER): Payer: Self-pay

## 2022-11-15 ENCOUNTER — Other Ambulatory Visit (HOSPITAL_BASED_OUTPATIENT_CLINIC_OR_DEPARTMENT_OTHER): Payer: Self-pay

## 2022-11-15 MED ORDER — ZEPBOUND 10 MG/0.5ML ~~LOC~~ SOAJ
10.0000 mg | SUBCUTANEOUS | 0 refills | Status: AC
  Filled 2022-11-15: qty 2, 28d supply, fill #0

## 2022-11-16 ENCOUNTER — Other Ambulatory Visit (HOSPITAL_BASED_OUTPATIENT_CLINIC_OR_DEPARTMENT_OTHER): Payer: Self-pay

## 2022-11-17 ENCOUNTER — Other Ambulatory Visit (HOSPITAL_BASED_OUTPATIENT_CLINIC_OR_DEPARTMENT_OTHER): Payer: Self-pay

## 2022-11-21 DIAGNOSIS — I10 Essential (primary) hypertension: Secondary | ICD-10-CM | POA: Diagnosis not present

## 2022-11-21 DIAGNOSIS — Z1329 Encounter for screening for other suspected endocrine disorder: Secondary | ICD-10-CM | POA: Diagnosis not present

## 2022-11-21 DIAGNOSIS — F988 Other specified behavioral and emotional disorders with onset usually occurring in childhood and adolescence: Secondary | ICD-10-CM | POA: Diagnosis not present

## 2022-11-21 DIAGNOSIS — Z13220 Encounter for screening for lipoid disorders: Secondary | ICD-10-CM | POA: Diagnosis not present

## 2022-11-23 ENCOUNTER — Other Ambulatory Visit (HOSPITAL_BASED_OUTPATIENT_CLINIC_OR_DEPARTMENT_OTHER): Payer: Self-pay

## 2022-11-23 MED ORDER — AMPHETAMINE-DEXTROAMPHET ER 30 MG PO CP24
60.0000 mg | ORAL_CAPSULE | Freq: Every morning | ORAL | 0 refills | Status: AC
  Filled 2022-11-23 – 2022-12-16 (×4): qty 60, 30d supply, fill #0

## 2022-11-23 MED ORDER — AMPHETAMINE-DEXTROAMPHET ER 30 MG PO CP24
60.0000 mg | ORAL_CAPSULE | Freq: Every morning | ORAL | 0 refills | Status: DC
  Filled 2023-02-14: qty 40, 20d supply, fill #0
  Filled 2023-02-14: qty 20, 10d supply, fill #0

## 2022-11-23 MED ORDER — AMPHETAMINE-DEXTROAMPHET ER 30 MG PO CP24
60.0000 mg | ORAL_CAPSULE | Freq: Every morning | ORAL | 0 refills | Status: AC
  Filled 2023-01-15: qty 60, 30d supply, fill #0

## 2022-11-23 MED ORDER — CYCLOBENZAPRINE HCL 10 MG PO TABS
10.0000 mg | ORAL_TABLET | Freq: Every day | ORAL | 0 refills | Status: AC
  Filled 2022-11-23: qty 30, 30d supply, fill #0

## 2022-11-23 MED ORDER — AMPHETAMINE-DEXTROAMPHETAMINE 10 MG PO TABS
10.0000 mg | ORAL_TABLET | Freq: Every day | ORAL | 0 refills | Status: AC
  Filled 2022-11-23: qty 30, 30d supply, fill #0

## 2022-11-27 ENCOUNTER — Other Ambulatory Visit (HOSPITAL_BASED_OUTPATIENT_CLINIC_OR_DEPARTMENT_OTHER): Payer: Self-pay

## 2022-12-13 ENCOUNTER — Other Ambulatory Visit (HOSPITAL_BASED_OUTPATIENT_CLINIC_OR_DEPARTMENT_OTHER): Payer: Self-pay

## 2022-12-13 MED ORDER — AMPHETAMINE-DEXTROAMPHETAMINE 20 MG PO TABS
20.0000 mg | ORAL_TABLET | Freq: Every day | ORAL | 0 refills | Status: DC
  Filled 2022-12-13: qty 30, 30d supply, fill #0

## 2022-12-15 ENCOUNTER — Other Ambulatory Visit (HOSPITAL_BASED_OUTPATIENT_CLINIC_OR_DEPARTMENT_OTHER): Payer: Self-pay

## 2022-12-16 ENCOUNTER — Other Ambulatory Visit (HOSPITAL_BASED_OUTPATIENT_CLINIC_OR_DEPARTMENT_OTHER): Payer: Self-pay

## 2022-12-21 ENCOUNTER — Other Ambulatory Visit: Payer: Self-pay | Admitting: Internal Medicine

## 2022-12-21 ENCOUNTER — Other Ambulatory Visit (HOSPITAL_BASED_OUTPATIENT_CLINIC_OR_DEPARTMENT_OTHER): Payer: Self-pay

## 2022-12-21 MED ORDER — AMLODIPINE BESYLATE 5 MG PO TABS
5.0000 mg | ORAL_TABLET | Freq: Every day | ORAL | 0 refills | Status: DC
  Filled 2022-12-21: qty 90, 90d supply, fill #0

## 2022-12-23 ENCOUNTER — Other Ambulatory Visit (HOSPITAL_BASED_OUTPATIENT_CLINIC_OR_DEPARTMENT_OTHER): Payer: Self-pay

## 2022-12-23 MED ORDER — ZEPBOUND 7.5 MG/0.5ML ~~LOC~~ SOAJ
7.5000 mg | SUBCUTANEOUS | 0 refills | Status: DC
  Filled 2022-12-23 – 2023-01-11 (×2): qty 2, 28d supply, fill #0

## 2023-01-03 DIAGNOSIS — L720 Epidermal cyst: Secondary | ICD-10-CM | POA: Diagnosis not present

## 2023-01-04 ENCOUNTER — Other Ambulatory Visit (HOSPITAL_BASED_OUTPATIENT_CLINIC_OR_DEPARTMENT_OTHER): Payer: Self-pay

## 2023-01-05 ENCOUNTER — Other Ambulatory Visit (HOSPITAL_BASED_OUTPATIENT_CLINIC_OR_DEPARTMENT_OTHER): Payer: Self-pay

## 2023-01-05 MED ORDER — DOXYCYCLINE HYCLATE 100 MG PO TABS
100.0000 mg | ORAL_TABLET | Freq: Two times a day (BID) | ORAL | 1 refills | Status: AC
  Filled 2023-01-05: qty 28, 14d supply, fill #0
  Filled 2023-02-05: qty 28, 14d supply, fill #1

## 2023-01-09 ENCOUNTER — Other Ambulatory Visit (HOSPITAL_BASED_OUTPATIENT_CLINIC_OR_DEPARTMENT_OTHER): Payer: Self-pay

## 2023-01-09 MED ORDER — AMPHETAMINE-DEXTROAMPHETAMINE 20 MG PO TABS
20.0000 mg | ORAL_TABLET | Freq: Every day | ORAL | 0 refills | Status: DC
  Filled 2023-01-09 – 2023-01-11 (×2): qty 30, 30d supply, fill #0

## 2023-01-11 ENCOUNTER — Other Ambulatory Visit (HOSPITAL_BASED_OUTPATIENT_CLINIC_OR_DEPARTMENT_OTHER): Payer: Self-pay

## 2023-01-12 ENCOUNTER — Other Ambulatory Visit (HOSPITAL_BASED_OUTPATIENT_CLINIC_OR_DEPARTMENT_OTHER): Payer: Self-pay

## 2023-01-16 ENCOUNTER — Other Ambulatory Visit: Payer: Self-pay

## 2023-01-16 ENCOUNTER — Other Ambulatory Visit (HOSPITAL_BASED_OUTPATIENT_CLINIC_OR_DEPARTMENT_OTHER): Payer: Self-pay

## 2023-02-01 ENCOUNTER — Encounter: Payer: Self-pay | Admitting: Internal Medicine

## 2023-02-01 ENCOUNTER — Ambulatory Visit: Payer: Commercial Managed Care - PPO | Attending: Internal Medicine | Admitting: Internal Medicine

## 2023-02-05 ENCOUNTER — Other Ambulatory Visit (HOSPITAL_BASED_OUTPATIENT_CLINIC_OR_DEPARTMENT_OTHER): Payer: Self-pay

## 2023-02-05 MED ORDER — AMPHETAMINE-DEXTROAMPHETAMINE 20 MG PO TABS
20.0000 mg | ORAL_TABLET | Freq: Every day | ORAL | 0 refills | Status: DC
  Filled 2023-02-05 – 2023-02-09 (×3): qty 30, 30d supply, fill #0

## 2023-02-08 ENCOUNTER — Other Ambulatory Visit: Payer: Self-pay

## 2023-02-08 ENCOUNTER — Other Ambulatory Visit (HOSPITAL_BASED_OUTPATIENT_CLINIC_OR_DEPARTMENT_OTHER): Payer: Self-pay

## 2023-02-08 MED ORDER — ACYCLOVIR 800 MG PO TABS
800.0000 mg | ORAL_TABLET | Freq: Three times a day (TID) | ORAL | 2 refills | Status: DC
  Filled 2023-02-08 – 2023-02-09 (×2): qty 30, 10d supply, fill #0
  Filled 2023-04-10: qty 30, 10d supply, fill #1
  Filled 2023-07-09: qty 30, 10d supply, fill #2

## 2023-02-09 ENCOUNTER — Other Ambulatory Visit: Payer: Self-pay

## 2023-02-09 ENCOUNTER — Other Ambulatory Visit (HOSPITAL_BASED_OUTPATIENT_CLINIC_OR_DEPARTMENT_OTHER): Payer: Self-pay

## 2023-02-14 ENCOUNTER — Other Ambulatory Visit (HOSPITAL_BASED_OUTPATIENT_CLINIC_OR_DEPARTMENT_OTHER): Payer: Self-pay

## 2023-02-14 MED ORDER — ZEPBOUND 7.5 MG/0.5ML ~~LOC~~ SOAJ
7.5000 mg | SUBCUTANEOUS | 0 refills | Status: DC
  Filled 2023-02-14: qty 2, 28d supply, fill #0

## 2023-02-16 ENCOUNTER — Other Ambulatory Visit (HOSPITAL_BASED_OUTPATIENT_CLINIC_OR_DEPARTMENT_OTHER): Payer: Self-pay

## 2023-02-22 ENCOUNTER — Other Ambulatory Visit (HOSPITAL_BASED_OUTPATIENT_CLINIC_OR_DEPARTMENT_OTHER): Payer: Self-pay

## 2023-03-08 ENCOUNTER — Other Ambulatory Visit: Payer: Self-pay

## 2023-03-08 ENCOUNTER — Other Ambulatory Visit: Payer: Self-pay | Admitting: Internal Medicine

## 2023-03-08 ENCOUNTER — Other Ambulatory Visit (HOSPITAL_BASED_OUTPATIENT_CLINIC_OR_DEPARTMENT_OTHER): Payer: Self-pay

## 2023-03-08 MED ORDER — AMPHETAMINE-DEXTROAMPHETAMINE 20 MG PO TABS
20.0000 mg | ORAL_TABLET | Freq: Every day | ORAL | 0 refills | Status: DC
  Filled 2023-03-08: qty 30, 30d supply, fill #0

## 2023-03-08 MED ORDER — AMLODIPINE BESYLATE 5 MG PO TABS
5.0000 mg | ORAL_TABLET | Freq: Every day | ORAL | 0 refills | Status: DC
  Filled 2023-03-08: qty 30, 30d supply, fill #0

## 2023-03-12 ENCOUNTER — Other Ambulatory Visit (HOSPITAL_BASED_OUTPATIENT_CLINIC_OR_DEPARTMENT_OTHER): Payer: Self-pay

## 2023-03-12 DIAGNOSIS — E7849 Other hyperlipidemia: Secondary | ICD-10-CM | POA: Diagnosis not present

## 2023-03-12 DIAGNOSIS — I10 Essential (primary) hypertension: Secondary | ICD-10-CM | POA: Diagnosis not present

## 2023-03-12 DIAGNOSIS — F17210 Nicotine dependence, cigarettes, uncomplicated: Secondary | ICD-10-CM | POA: Diagnosis not present

## 2023-03-12 DIAGNOSIS — F988 Other specified behavioral and emotional disorders with onset usually occurring in childhood and adolescence: Secondary | ICD-10-CM | POA: Diagnosis not present

## 2023-03-12 MED ORDER — AMPHETAMINE-DEXTROAMPHET ER 30 MG PO CP24
60.0000 mg | ORAL_CAPSULE | Freq: Every morning | ORAL | 0 refills | Status: AC
  Filled 2023-05-11: qty 60, 30d supply, fill #0

## 2023-03-12 MED ORDER — AMPHETAMINE-DEXTROAMPHETAMINE 20 MG PO TABS
20.0000 mg | ORAL_TABLET | Freq: Every day | ORAL | 0 refills | Status: AC
  Filled 2023-03-12 – 2023-04-09 (×4): qty 30, 30d supply, fill #0

## 2023-03-12 MED ORDER — AMPHETAMINE-DEXTROAMPHETAMINE 20 MG PO TABS
20.0000 mg | ORAL_TABLET | Freq: Every day | ORAL | 0 refills | Status: AC
  Filled 2023-06-05 – 2023-06-07 (×2): qty 30, 30d supply, fill #0

## 2023-03-12 MED ORDER — AMPHETAMINE-DEXTROAMPHETAMINE 20 MG PO TABS
20.0000 mg | ORAL_TABLET | Freq: Every day | ORAL | 0 refills | Status: AC
  Filled 2023-05-09: qty 30, 30d supply, fill #0

## 2023-03-12 MED ORDER — AMPHETAMINE-DEXTROAMPHET ER 30 MG PO CP24
60.0000 mg | ORAL_CAPSULE | Freq: Every morning | ORAL | 0 refills | Status: AC
  Filled 2023-03-12 – 2023-03-13 (×2): qty 60, 30d supply, fill #0

## 2023-03-12 MED ORDER — AMPHETAMINE-DEXTROAMPHET ER 30 MG PO CP24
60.0000 mg | ORAL_CAPSULE | Freq: Every morning | ORAL | 0 refills | Status: AC
  Filled 2023-04-11: qty 60, 30d supply, fill #0

## 2023-03-13 ENCOUNTER — Other Ambulatory Visit (HOSPITAL_BASED_OUTPATIENT_CLINIC_OR_DEPARTMENT_OTHER): Payer: Self-pay

## 2023-03-16 ENCOUNTER — Other Ambulatory Visit (HOSPITAL_BASED_OUTPATIENT_CLINIC_OR_DEPARTMENT_OTHER): Payer: Self-pay

## 2023-03-16 MED ORDER — ZEPBOUND 7.5 MG/0.5ML ~~LOC~~ SOAJ
7.5000 mg | SUBCUTANEOUS | 0 refills | Status: DC
  Filled 2023-03-16: qty 2, 28d supply, fill #0

## 2023-04-03 ENCOUNTER — Other Ambulatory Visit (HOSPITAL_BASED_OUTPATIENT_CLINIC_OR_DEPARTMENT_OTHER): Payer: Self-pay

## 2023-04-05 ENCOUNTER — Other Ambulatory Visit (HOSPITAL_BASED_OUTPATIENT_CLINIC_OR_DEPARTMENT_OTHER): Payer: Self-pay

## 2023-04-05 ENCOUNTER — Other Ambulatory Visit: Payer: Self-pay

## 2023-04-09 ENCOUNTER — Other Ambulatory Visit (HOSPITAL_BASED_OUTPATIENT_CLINIC_OR_DEPARTMENT_OTHER): Payer: Self-pay

## 2023-04-10 ENCOUNTER — Other Ambulatory Visit (HOSPITAL_BASED_OUTPATIENT_CLINIC_OR_DEPARTMENT_OTHER): Payer: Self-pay

## 2023-04-10 MED ORDER — ZEPBOUND 7.5 MG/0.5ML ~~LOC~~ SOAJ
7.5000 mg | SUBCUTANEOUS | 0 refills | Status: DC
  Filled 2023-04-10: qty 2, 28d supply, fill #0

## 2023-04-12 ENCOUNTER — Other Ambulatory Visit (HOSPITAL_BASED_OUTPATIENT_CLINIC_OR_DEPARTMENT_OTHER): Payer: Self-pay

## 2023-04-13 ENCOUNTER — Other Ambulatory Visit (HOSPITAL_BASED_OUTPATIENT_CLINIC_OR_DEPARTMENT_OTHER): Payer: Self-pay

## 2023-05-09 ENCOUNTER — Other Ambulatory Visit (HOSPITAL_BASED_OUTPATIENT_CLINIC_OR_DEPARTMENT_OTHER): Payer: Self-pay

## 2023-05-09 ENCOUNTER — Other Ambulatory Visit: Payer: Self-pay | Admitting: Internal Medicine

## 2023-05-10 ENCOUNTER — Other Ambulatory Visit (HOSPITAL_BASED_OUTPATIENT_CLINIC_OR_DEPARTMENT_OTHER): Payer: Self-pay

## 2023-05-10 MED ORDER — AMLODIPINE BESYLATE 5 MG PO TABS
5.0000 mg | ORAL_TABLET | Freq: Every day | ORAL | 0 refills | Status: DC
  Filled 2023-05-10: qty 30, 30d supply, fill #0

## 2023-05-11 ENCOUNTER — Other Ambulatory Visit (HOSPITAL_BASED_OUTPATIENT_CLINIC_OR_DEPARTMENT_OTHER): Payer: Self-pay

## 2023-05-11 MED ORDER — ZEPBOUND 7.5 MG/0.5ML ~~LOC~~ SOAJ
7.5000 mg | SUBCUTANEOUS | 0 refills | Status: DC
  Filled 2023-05-11: qty 2, 28d supply, fill #0

## 2023-05-12 ENCOUNTER — Other Ambulatory Visit (HOSPITAL_BASED_OUTPATIENT_CLINIC_OR_DEPARTMENT_OTHER): Payer: Self-pay

## 2023-05-14 ENCOUNTER — Other Ambulatory Visit (HOSPITAL_BASED_OUTPATIENT_CLINIC_OR_DEPARTMENT_OTHER): Payer: Self-pay

## 2023-06-05 ENCOUNTER — Other Ambulatory Visit (HOSPITAL_BASED_OUTPATIENT_CLINIC_OR_DEPARTMENT_OTHER): Payer: Self-pay

## 2023-06-06 ENCOUNTER — Other Ambulatory Visit (HOSPITAL_BASED_OUTPATIENT_CLINIC_OR_DEPARTMENT_OTHER): Payer: Self-pay

## 2023-06-07 ENCOUNTER — Encounter (HOSPITAL_BASED_OUTPATIENT_CLINIC_OR_DEPARTMENT_OTHER): Payer: Self-pay

## 2023-06-07 ENCOUNTER — Other Ambulatory Visit (HOSPITAL_BASED_OUTPATIENT_CLINIC_OR_DEPARTMENT_OTHER): Payer: Self-pay

## 2023-06-08 ENCOUNTER — Other Ambulatory Visit (HOSPITAL_BASED_OUTPATIENT_CLINIC_OR_DEPARTMENT_OTHER): Payer: Self-pay

## 2023-06-08 DIAGNOSIS — I10 Essential (primary) hypertension: Secondary | ICD-10-CM | POA: Diagnosis not present

## 2023-06-08 DIAGNOSIS — F909 Attention-deficit hyperactivity disorder, unspecified type: Secondary | ICD-10-CM | POA: Diagnosis not present

## 2023-06-08 MED ORDER — AMPHETAMINE-DEXTROAMPHETAMINE 20 MG PO TABS
20.0000 mg | ORAL_TABLET | Freq: Every day | ORAL | 0 refills | Status: AC
  Filled 2023-08-06: qty 30, 30d supply, fill #0

## 2023-06-08 MED ORDER — ZEPBOUND 7.5 MG/0.5ML ~~LOC~~ SOAJ
7.5000 mg | SUBCUTANEOUS | 0 refills | Status: DC
Start: 2023-06-08 — End: 2023-07-09
  Filled 2023-06-08: qty 2, 28d supply, fill #0

## 2023-06-08 MED ORDER — AMPHETAMINE-DEXTROAMPHET ER 30 MG PO CP24
60.0000 mg | ORAL_CAPSULE | Freq: Every morning | ORAL | 0 refills | Status: DC
  Filled 2023-07-09: qty 60, 30d supply, fill #0

## 2023-06-08 MED ORDER — AMPHETAMINE-DEXTROAMPHET ER 30 MG PO CP24
60.0000 mg | ORAL_CAPSULE | Freq: Every morning | ORAL | 0 refills | Status: DC
  Filled 2023-08-08: qty 60, 30d supply, fill #0

## 2023-06-08 MED ORDER — AMPHETAMINE-DEXTROAMPHETAMINE 20 MG PO TABS
20.0000 mg | ORAL_TABLET | Freq: Every day | ORAL | 0 refills | Status: AC
  Filled 2023-06-08 – 2023-07-05 (×3): qty 30, 30d supply, fill #0

## 2023-06-08 MED ORDER — AMPHETAMINE-DEXTROAMPHETAMINE 20 MG PO TABS
20.0000 mg | ORAL_TABLET | Freq: Every day | ORAL | 0 refills | Status: DC
  Filled 2023-09-04: qty 30, 30d supply, fill #0

## 2023-06-08 MED ORDER — AMPHETAMINE-DEXTROAMPHET ER 30 MG PO CP24
60.0000 mg | ORAL_CAPSULE | Freq: Every morning | ORAL | 0 refills | Status: AC
  Filled 2023-06-08 – 2023-06-09 (×3): qty 60, 30d supply, fill #0

## 2023-06-09 ENCOUNTER — Other Ambulatory Visit (HOSPITAL_BASED_OUTPATIENT_CLINIC_OR_DEPARTMENT_OTHER): Payer: Self-pay

## 2023-06-19 ENCOUNTER — Other Ambulatory Visit: Payer: Self-pay | Admitting: Internal Medicine

## 2023-06-20 ENCOUNTER — Other Ambulatory Visit (HOSPITAL_BASED_OUTPATIENT_CLINIC_OR_DEPARTMENT_OTHER): Payer: Self-pay

## 2023-06-20 MED ORDER — AMLODIPINE BESYLATE 5 MG PO TABS
5.0000 mg | ORAL_TABLET | Freq: Every day | ORAL | 0 refills | Status: DC
  Filled 2023-06-20: qty 15, 15d supply, fill #0

## 2023-07-05 ENCOUNTER — Other Ambulatory Visit (HOSPITAL_BASED_OUTPATIENT_CLINIC_OR_DEPARTMENT_OTHER): Payer: Self-pay

## 2023-07-05 ENCOUNTER — Other Ambulatory Visit: Payer: Self-pay

## 2023-07-09 ENCOUNTER — Other Ambulatory Visit (HOSPITAL_BASED_OUTPATIENT_CLINIC_OR_DEPARTMENT_OTHER): Payer: Self-pay

## 2023-07-09 ENCOUNTER — Other Ambulatory Visit: Payer: Self-pay | Admitting: Internal Medicine

## 2023-07-09 MED ORDER — ZEPBOUND 7.5 MG/0.5ML ~~LOC~~ SOAJ
7.5000 mg | SUBCUTANEOUS | 0 refills | Status: DC
Start: 2023-07-09 — End: 2023-08-06
  Filled 2023-07-09: qty 2, 28d supply, fill #0

## 2023-07-10 ENCOUNTER — Other Ambulatory Visit (HOSPITAL_BASED_OUTPATIENT_CLINIC_OR_DEPARTMENT_OTHER): Payer: Self-pay

## 2023-07-11 ENCOUNTER — Other Ambulatory Visit (HOSPITAL_BASED_OUTPATIENT_CLINIC_OR_DEPARTMENT_OTHER): Payer: Self-pay

## 2023-07-11 MED ORDER — AMLODIPINE BESYLATE 5 MG PO TABS
5.0000 mg | ORAL_TABLET | Freq: Every day | ORAL | 0 refills | Status: AC
  Filled 2023-07-11: qty 7, 7d supply, fill #0

## 2023-07-11 NOTE — Telephone Encounter (Signed)
 This is a [place] pt.

## 2023-08-06 ENCOUNTER — Other Ambulatory Visit (HOSPITAL_BASED_OUTPATIENT_CLINIC_OR_DEPARTMENT_OTHER): Payer: Self-pay

## 2023-08-06 ENCOUNTER — Other Ambulatory Visit: Payer: Self-pay

## 2023-08-06 MED ORDER — ZEPBOUND 7.5 MG/0.5ML ~~LOC~~ SOAJ
7.5000 mg | SUBCUTANEOUS | 0 refills | Status: DC
Start: 2023-08-05 — End: 2023-09-03
  Filled 2023-08-06: qty 2, 28d supply, fill #0

## 2023-08-07 ENCOUNTER — Other Ambulatory Visit (HOSPITAL_BASED_OUTPATIENT_CLINIC_OR_DEPARTMENT_OTHER): Payer: Self-pay

## 2023-08-08 ENCOUNTER — Other Ambulatory Visit (HOSPITAL_BASED_OUTPATIENT_CLINIC_OR_DEPARTMENT_OTHER): Payer: Self-pay

## 2023-09-03 ENCOUNTER — Other Ambulatory Visit (HOSPITAL_BASED_OUTPATIENT_CLINIC_OR_DEPARTMENT_OTHER): Payer: Self-pay

## 2023-09-03 MED ORDER — ZEPBOUND 7.5 MG/0.5ML ~~LOC~~ SOAJ
7.5000 mg | SUBCUTANEOUS | 0 refills | Status: DC
  Filled 2023-09-03: qty 2, 28d supply, fill #0

## 2023-09-04 ENCOUNTER — Other Ambulatory Visit (HOSPITAL_BASED_OUTPATIENT_CLINIC_OR_DEPARTMENT_OTHER): Payer: Self-pay

## 2023-09-05 ENCOUNTER — Other Ambulatory Visit (HOSPITAL_BASED_OUTPATIENT_CLINIC_OR_DEPARTMENT_OTHER): Payer: Self-pay

## 2023-09-05 ENCOUNTER — Other Ambulatory Visit: Payer: Self-pay

## 2023-09-05 DIAGNOSIS — F909 Attention-deficit hyperactivity disorder, unspecified type: Secondary | ICD-10-CM | POA: Diagnosis not present

## 2023-09-05 DIAGNOSIS — I10 Essential (primary) hypertension: Secondary | ICD-10-CM | POA: Diagnosis not present

## 2023-09-05 MED ORDER — AMPHETAMINE-DEXTROAMPHETAMINE 20 MG PO TABS
20.0000 mg | ORAL_TABLET | Freq: Every day | ORAL | 0 refills | Status: AC
  Filled 2023-09-05 – 2023-10-03 (×2): qty 30, 30d supply, fill #0

## 2023-09-05 MED ORDER — AMPHETAMINE-DEXTROAMPHET ER 30 MG PO CP24
60.0000 mg | ORAL_CAPSULE | Freq: Every morning | ORAL | 0 refills | Status: AC
  Filled 2023-09-05: qty 57, 28d supply, fill #0
  Filled 2023-09-05: qty 3, 2d supply, fill #0

## 2023-09-05 MED ORDER — AMPHETAMINE-DEXTROAMPHET ER 30 MG PO CP24
60.0000 mg | ORAL_CAPSULE | Freq: Every morning | ORAL | 0 refills | Status: DC
  Filled 2023-11-29 – 2023-12-03 (×5): qty 60, 30d supply, fill #0

## 2023-09-05 MED ORDER — AMPHETAMINE-DEXTROAMPHET ER 30 MG PO CP24
60.0000 mg | ORAL_CAPSULE | Freq: Every morning | ORAL | 0 refills | Status: AC
  Filled 2023-10-30 – 2023-11-03 (×7): qty 60, 30d supply, fill #0

## 2023-09-05 MED ORDER — AMPHETAMINE-DEXTROAMPHETAMINE 20 MG PO TABS
20.0000 mg | ORAL_TABLET | Freq: Every day | ORAL | 0 refills | Status: AC
Start: 2023-10-05 — End: ?
  Filled 2023-10-05 – 2023-11-01 (×4): qty 30, 30d supply, fill #0

## 2023-09-05 MED ORDER — ACYCLOVIR 800 MG PO TABS
800.0000 mg | ORAL_TABLET | Freq: Three times a day (TID) | ORAL | 5 refills | Status: AC
  Filled 2023-09-05: qty 30, 10d supply, fill #0
  Filled 2023-10-03: qty 30, 10d supply, fill #1
  Filled 2023-10-30: qty 30, 10d supply, fill #2
  Filled 2023-11-29: qty 30, 10d supply, fill #3

## 2023-09-05 MED ORDER — AMPHETAMINE-DEXTROAMPHETAMINE 20 MG PO TABS
20.0000 mg | ORAL_TABLET | Freq: Every day | ORAL | 0 refills | Status: DC
  Filled 2023-11-29: qty 30, 30d supply, fill #0

## 2023-10-03 ENCOUNTER — Other Ambulatory Visit: Payer: Self-pay

## 2023-10-03 ENCOUNTER — Other Ambulatory Visit (HOSPITAL_BASED_OUTPATIENT_CLINIC_OR_DEPARTMENT_OTHER): Payer: Self-pay

## 2023-10-03 MED ORDER — ZEPBOUND 7.5 MG/0.5ML ~~LOC~~ SOAJ
7.5000 mg | SUBCUTANEOUS | 0 refills | Status: DC
  Filled 2023-10-03: qty 2, 28d supply, fill #0

## 2023-10-03 MED ORDER — AMPHETAMINE-DEXTROAMPHET ER 30 MG PO CP24
60.0000 mg | ORAL_CAPSULE | Freq: Every morning | ORAL | 0 refills | Status: AC
  Filled 2023-10-04: qty 60, 30d supply, fill #0

## 2023-10-04 ENCOUNTER — Other Ambulatory Visit: Payer: Self-pay

## 2023-10-04 ENCOUNTER — Other Ambulatory Visit (HOSPITAL_BASED_OUTPATIENT_CLINIC_OR_DEPARTMENT_OTHER): Payer: Self-pay

## 2023-10-05 ENCOUNTER — Other Ambulatory Visit (HOSPITAL_BASED_OUTPATIENT_CLINIC_OR_DEPARTMENT_OTHER): Payer: Self-pay

## 2023-10-30 ENCOUNTER — Other Ambulatory Visit: Payer: Self-pay

## 2023-10-30 ENCOUNTER — Other Ambulatory Visit (HOSPITAL_BASED_OUTPATIENT_CLINIC_OR_DEPARTMENT_OTHER): Payer: Self-pay

## 2023-10-31 ENCOUNTER — Other Ambulatory Visit (HOSPITAL_BASED_OUTPATIENT_CLINIC_OR_DEPARTMENT_OTHER): Payer: Self-pay

## 2023-11-01 ENCOUNTER — Other Ambulatory Visit (HOSPITAL_BASED_OUTPATIENT_CLINIC_OR_DEPARTMENT_OTHER): Payer: Self-pay

## 2023-11-01 ENCOUNTER — Other Ambulatory Visit: Payer: Self-pay

## 2023-11-01 MED ORDER — ZEPBOUND 7.5 MG/0.5ML ~~LOC~~ SOAJ
7.5000 mg | SUBCUTANEOUS | 0 refills | Status: DC
  Filled 2023-11-01: qty 2, 28d supply, fill #0

## 2023-11-02 ENCOUNTER — Other Ambulatory Visit (HOSPITAL_BASED_OUTPATIENT_CLINIC_OR_DEPARTMENT_OTHER): Payer: Self-pay

## 2023-11-03 ENCOUNTER — Other Ambulatory Visit (HOSPITAL_BASED_OUTPATIENT_CLINIC_OR_DEPARTMENT_OTHER): Payer: Self-pay

## 2023-11-15 ENCOUNTER — Other Ambulatory Visit: Payer: Self-pay | Admitting: Medical Genetics

## 2023-11-29 ENCOUNTER — Other Ambulatory Visit: Payer: Self-pay

## 2023-11-29 ENCOUNTER — Other Ambulatory Visit (HOSPITAL_BASED_OUTPATIENT_CLINIC_OR_DEPARTMENT_OTHER): Payer: Self-pay

## 2023-11-30 ENCOUNTER — Other Ambulatory Visit (HOSPITAL_BASED_OUTPATIENT_CLINIC_OR_DEPARTMENT_OTHER): Payer: Self-pay

## 2023-11-30 MED ORDER — ZEPBOUND 7.5 MG/0.5ML ~~LOC~~ SOAJ
7.5000 mg | SUBCUTANEOUS | 0 refills | Status: DC
  Filled 2023-11-30: qty 2, 28d supply, fill #0

## 2023-12-01 ENCOUNTER — Other Ambulatory Visit (HOSPITAL_BASED_OUTPATIENT_CLINIC_OR_DEPARTMENT_OTHER): Payer: Self-pay

## 2023-12-03 ENCOUNTER — Other Ambulatory Visit (HOSPITAL_BASED_OUTPATIENT_CLINIC_OR_DEPARTMENT_OTHER): Payer: Self-pay

## 2024-01-01 ENCOUNTER — Encounter (HOSPITAL_BASED_OUTPATIENT_CLINIC_OR_DEPARTMENT_OTHER): Payer: Self-pay

## 2024-01-01 ENCOUNTER — Other Ambulatory Visit (HOSPITAL_BASED_OUTPATIENT_CLINIC_OR_DEPARTMENT_OTHER): Payer: Self-pay

## 2024-01-01 DIAGNOSIS — F909 Attention-deficit hyperactivity disorder, unspecified type: Secondary | ICD-10-CM | POA: Diagnosis not present

## 2024-01-01 MED ORDER — AMPHETAMINE-DEXTROAMPHETAMINE 20 MG PO TABS
20.0000 mg | ORAL_TABLET | Freq: Every day | ORAL | 0 refills | Status: AC
  Filled 2024-01-01: qty 30, 30d supply, fill #0

## 2024-01-01 MED ORDER — ZEPBOUND 7.5 MG/0.5ML ~~LOC~~ SOAJ
7.5000 mg | SUBCUTANEOUS | 0 refills | Status: DC
  Filled 2024-01-01: qty 2, 28d supply, fill #0

## 2024-01-01 MED ORDER — AMPHETAMINE-DEXTROAMPHET ER 30 MG PO CP24
60.0000 mg | ORAL_CAPSULE | Freq: Every morning | ORAL | 0 refills | Status: AC
  Filled 2024-01-01 – 2024-01-02 (×2): qty 60, 30d supply, fill #0

## 2024-01-01 MED ORDER — AMPHETAMINE-DEXTROAMPHET ER 30 MG PO CP24
60.0000 mg | ORAL_CAPSULE | Freq: Every morning | ORAL | 0 refills | Status: DC
  Filled 2024-03-01: qty 60, 30d supply, fill #0

## 2024-01-01 MED ORDER — AMPHETAMINE-DEXTROAMPHETAMINE 20 MG PO TABS
20.0000 mg | ORAL_TABLET | Freq: Every day | ORAL | 0 refills | Status: DC
  Filled 2024-03-01: qty 30, 30d supply, fill #0

## 2024-01-01 MED ORDER — AMPHETAMINE-DEXTROAMPHETAMINE 20 MG PO TABS
20.0000 mg | ORAL_TABLET | Freq: Every day | ORAL | 0 refills | Status: AC
  Filled 2024-01-31: qty 30, 30d supply, fill #0

## 2024-01-01 MED ORDER — AMPHETAMINE-DEXTROAMPHET ER 30 MG PO CP24
60.0000 mg | ORAL_CAPSULE | ORAL | 0 refills | Status: AC
  Filled 2024-01-31: qty 50, 25d supply, fill #0
  Filled 2024-01-31: qty 10, 5d supply, fill #0

## 2024-01-02 ENCOUNTER — Other Ambulatory Visit (HOSPITAL_BASED_OUTPATIENT_CLINIC_OR_DEPARTMENT_OTHER): Payer: Self-pay

## 2024-01-28 ENCOUNTER — Other Ambulatory Visit: Payer: Self-pay

## 2024-01-28 ENCOUNTER — Other Ambulatory Visit (HOSPITAL_BASED_OUTPATIENT_CLINIC_OR_DEPARTMENT_OTHER): Payer: Self-pay

## 2024-01-28 MED ORDER — ZEPBOUND 7.5 MG/0.5ML ~~LOC~~ SOAJ
SUBCUTANEOUS | 0 refills | Status: AC
  Filled 2024-01-28: qty 2, 28d supply, fill #0

## 2024-01-31 ENCOUNTER — Other Ambulatory Visit (HOSPITAL_BASED_OUTPATIENT_CLINIC_OR_DEPARTMENT_OTHER): Payer: Self-pay

## 2024-02-07 ENCOUNTER — Other Ambulatory Visit: Payer: Self-pay | Admitting: Medical Genetics

## 2024-02-07 DIAGNOSIS — Z006 Encounter for examination for normal comparison and control in clinical research program: Secondary | ICD-10-CM

## 2024-02-14 ENCOUNTER — Other Ambulatory Visit (HOSPITAL_BASED_OUTPATIENT_CLINIC_OR_DEPARTMENT_OTHER): Payer: Self-pay

## 2024-02-14 MED ORDER — FLUZONE 0.5 ML IM SUSY
0.5000 mL | PREFILLED_SYRINGE | Freq: Once | INTRAMUSCULAR | 0 refills | Status: AC
  Filled 2024-02-14: qty 0.5, 1d supply, fill #0

## 2024-02-27 ENCOUNTER — Other Ambulatory Visit (HOSPITAL_BASED_OUTPATIENT_CLINIC_OR_DEPARTMENT_OTHER): Payer: Self-pay

## 2024-02-27 MED ORDER — ZEPBOUND 10 MG/0.5ML ~~LOC~~ SOAJ
10.0000 mg | SUBCUTANEOUS | 0 refills | Status: DC
  Filled 2024-02-27: qty 2, 28d supply, fill #0

## 2024-03-01 ENCOUNTER — Other Ambulatory Visit (HOSPITAL_BASED_OUTPATIENT_CLINIC_OR_DEPARTMENT_OTHER): Payer: Self-pay

## 2024-03-27 ENCOUNTER — Other Ambulatory Visit (HOSPITAL_BASED_OUTPATIENT_CLINIC_OR_DEPARTMENT_OTHER): Payer: Self-pay

## 2024-03-27 DIAGNOSIS — F909 Attention-deficit hyperactivity disorder, unspecified type: Secondary | ICD-10-CM | POA: Diagnosis not present

## 2024-03-27 DIAGNOSIS — I10 Essential (primary) hypertension: Secondary | ICD-10-CM | POA: Diagnosis not present

## 2024-03-27 MED ORDER — AMPHETAMINE-DEXTROAMPHET ER 30 MG PO CP24: 60.0000 mg | ORAL_CAPSULE | Freq: Every morning | ORAL | 0 refills | Status: AC

## 2024-03-27 MED ORDER — AMPHETAMINE-DEXTROAMPHETAMINE 20 MG PO TABS
20.0000 mg | ORAL_TABLET | Freq: Every day | ORAL | 0 refills | Status: AC
  Filled 2024-03-29: qty 30, 30d supply, fill #0

## 2024-03-27 MED ORDER — AMPHETAMINE-DEXTROAMPHETAMINE 20 MG PO TABS
20.0000 mg | ORAL_TABLET | Freq: Every day | ORAL | 0 refills | Status: AC
  Filled 2024-04-28: qty 30, 30d supply, fill #0

## 2024-03-27 MED ORDER — AMPHETAMINE-DEXTROAMPHETAMINE 20 MG PO TABS: 20.0000 mg | ORAL_TABLET | Freq: Every day | ORAL | 0 refills | Status: AC

## 2024-03-27 MED ORDER — AMPHETAMINE-DEXTROAMPHET ER 30 MG PO CP24
60.0000 mg | ORAL_CAPSULE | Freq: Every morning | ORAL | 0 refills | Status: AC
  Filled 2024-04-28: qty 60, 30d supply, fill #0

## 2024-03-27 MED ORDER — AMPHETAMINE-DEXTROAMPHET ER 30 MG PO CP24
60.0000 mg | ORAL_CAPSULE | Freq: Every morning | ORAL | 0 refills | Status: AC
  Filled 2024-03-29: qty 60, 30d supply, fill #0

## 2024-03-29 ENCOUNTER — Other Ambulatory Visit (HOSPITAL_BASED_OUTPATIENT_CLINIC_OR_DEPARTMENT_OTHER): Payer: Self-pay

## 2024-04-07 ENCOUNTER — Other Ambulatory Visit (HOSPITAL_BASED_OUTPATIENT_CLINIC_OR_DEPARTMENT_OTHER): Payer: Self-pay

## 2024-04-07 MED ORDER — ZEPBOUND 10 MG/0.5ML ~~LOC~~ SOAJ
10.0000 mg | SUBCUTANEOUS | 0 refills | Status: DC
  Filled 2024-04-07: qty 2, 28d supply, fill #0

## 2024-04-28 ENCOUNTER — Other Ambulatory Visit (HOSPITAL_BASED_OUTPATIENT_CLINIC_OR_DEPARTMENT_OTHER): Payer: Self-pay

## 2024-05-06 ENCOUNTER — Other Ambulatory Visit (HOSPITAL_BASED_OUTPATIENT_CLINIC_OR_DEPARTMENT_OTHER): Payer: Self-pay

## 2024-05-06 MED ORDER — ZEPBOUND 10 MG/0.5ML ~~LOC~~ SOAJ
10.0000 mg | SUBCUTANEOUS | 0 refills | Status: AC
  Filled 2024-05-06 (×2): qty 2, 28d supply, fill #0

## 2024-05-08 ENCOUNTER — Other Ambulatory Visit (HOSPITAL_BASED_OUTPATIENT_CLINIC_OR_DEPARTMENT_OTHER): Payer: Self-pay

## 2024-05-19 ENCOUNTER — Emergency Department (HOSPITAL_BASED_OUTPATIENT_CLINIC_OR_DEPARTMENT_OTHER)
Admission: EM | Admit: 2024-05-19 | Discharge: 2024-05-20 | Disposition: A | Attending: Emergency Medicine | Admitting: Emergency Medicine

## 2024-05-19 ENCOUNTER — Encounter (HOSPITAL_BASED_OUTPATIENT_CLINIC_OR_DEPARTMENT_OTHER): Payer: Self-pay

## 2024-05-19 ENCOUNTER — Emergency Department (HOSPITAL_BASED_OUTPATIENT_CLINIC_OR_DEPARTMENT_OTHER)

## 2024-05-19 DIAGNOSIS — R002 Palpitations: Secondary | ICD-10-CM | POA: Insufficient documentation

## 2024-05-19 DIAGNOSIS — I10 Essential (primary) hypertension: Secondary | ICD-10-CM | POA: Insufficient documentation

## 2024-05-19 DIAGNOSIS — Z79899 Other long term (current) drug therapy: Secondary | ICD-10-CM | POA: Insufficient documentation

## 2024-05-19 HISTORY — DX: Essential (primary) hypertension: I10

## 2024-05-19 LAB — CBC
HCT: 40.9 % (ref 36.0–46.0)
Hemoglobin: 14 g/dL (ref 12.0–15.0)
MCH: 31.3 pg (ref 26.0–34.0)
MCHC: 34.2 g/dL (ref 30.0–36.0)
MCV: 91.5 fL (ref 80.0–100.0)
Platelets: 317 10*3/uL (ref 150–400)
RBC: 4.47 MIL/uL (ref 3.87–5.11)
RDW: 12.9 % (ref 11.5–15.5)
WBC: 10.4 10*3/uL (ref 4.0–10.5)
nRBC: 0 % (ref 0.0–0.2)

## 2024-05-19 LAB — COMPREHENSIVE METABOLIC PANEL WITH GFR
ALT: 19 U/L (ref 0–44)
AST: 23 U/L (ref 15–41)
Albumin: 4.6 g/dL (ref 3.5–5.0)
Alkaline Phosphatase: 82 U/L (ref 38–126)
Anion gap: 13 (ref 5–15)
BUN: 14 mg/dL (ref 6–20)
CO2: 26 mmol/L (ref 22–32)
Calcium: 9.8 mg/dL (ref 8.9–10.3)
Chloride: 104 mmol/L (ref 98–111)
Creatinine, Ser: 0.67 mg/dL (ref 0.44–1.00)
GFR, Estimated: >60 mL/min
Glucose, Bld: 114 mg/dL — ABNORMAL HIGH (ref 70–99)
Potassium: 3.6 mmol/L (ref 3.5–5.1)
Sodium: 143 mmol/L (ref 135–145)
Total Bilirubin: 0.2 mg/dL (ref 0.0–1.2)
Total Protein: 6.9 g/dL (ref 6.5–8.1)

## 2024-05-19 LAB — TROPONIN T, HIGH SENSITIVITY: Troponin T High Sensitivity: 7 ng/L (ref 0–19)

## 2024-05-19 MED ORDER — SODIUM CHLORIDE 0.9 % IV BOLUS
500.0000 mL | Freq: Once | INTRAVENOUS | Status: AC
  Administered 2024-05-19: 500 mL via INTRAVENOUS

## 2024-05-19 NOTE — ED Triage Notes (Signed)
 Pt states approx 3 hrs ago she started having palpitations and chest pressure in center of chest.   No radiation Denies N/V C/o HA

## 2024-05-20 ENCOUNTER — Emergency Department (HOSPITAL_BASED_OUTPATIENT_CLINIC_OR_DEPARTMENT_OTHER)

## 2024-05-20 LAB — TROPONIN T, HIGH SENSITIVITY: Troponin T High Sensitivity: 7 ng/L (ref 0–19)

## 2024-05-20 LAB — RESP PANEL BY RT-PCR (RSV, FLU A&B, COVID)  RVPGX2
Influenza A by PCR: NEGATIVE
Influenza B by PCR: NEGATIVE
Resp Syncytial Virus by PCR: NEGATIVE
SARS Coronavirus 2 by RT PCR: NEGATIVE

## 2024-05-20 MED ORDER — IOHEXOL 350 MG/ML SOLN
75.0000 mL | Freq: Once | INTRAVENOUS | Status: AC | PRN
  Administered 2024-05-20: 75 mL via INTRAVENOUS

## 2024-05-20 MED ORDER — MAGNESIUM SULFATE 2 GM/50ML IV SOLN
2.0000 g | Freq: Once | INTRAVENOUS | Status: AC
  Administered 2024-05-20: 2 g via INTRAVENOUS
  Filled 2024-05-20: qty 50

## 2024-05-20 MED ORDER — KETOROLAC TROMETHAMINE 30 MG/ML IJ SOLN
15.0000 mg | Freq: Once | INTRAMUSCULAR | Status: AC
  Administered 2024-05-20: 15 mg via INTRAVENOUS
  Filled 2024-05-20: qty 1

## 2024-05-20 NOTE — ED Notes (Signed)
 Pt. Reports she is blind in her left eye at baseline

## 2024-05-20 NOTE — ED Notes (Signed)
 Pt. Reports headache is much better after medication admin. EDP is bedside updating pt. after consulting with Cardiology
